# Patient Record
Sex: Female | Born: 1945
Health system: Southern US, Community
[De-identification: ages and names within clinical notes are randomized; demographics above are authoritative.]

## PROBLEM LIST (undated history)

## (undated) DIAGNOSIS — T8859XA Other complications of anesthesia, initial encounter: Secondary | ICD-10-CM

## (undated) DIAGNOSIS — K589 Irritable bowel syndrome without diarrhea: Secondary | ICD-10-CM

## (undated) DIAGNOSIS — E785 Hyperlipidemia, unspecified: Secondary | ICD-10-CM

## (undated) DIAGNOSIS — R112 Nausea with vomiting, unspecified: Secondary | ICD-10-CM

## (undated) DIAGNOSIS — T4145XA Adverse effect of unspecified anesthetic, initial encounter: Secondary | ICD-10-CM

## (undated) DIAGNOSIS — N6320 Unspecified lump in the left breast, unspecified quadrant: Secondary | ICD-10-CM

## (undated) DIAGNOSIS — M810 Age-related osteoporosis without current pathological fracture: Secondary | ICD-10-CM

## (undated) DIAGNOSIS — K219 Gastro-esophageal reflux disease without esophagitis: Secondary | ICD-10-CM

## (undated) DIAGNOSIS — Z9889 Other specified postprocedural states: Secondary | ICD-10-CM

## (undated) HISTORY — PX: INCONTINENCE SURGERY: SHX676

## (undated) HISTORY — PX: ABDOMINAL HYSTERECTOMY: SHX81

## (undated) HISTORY — PX: CHOLECYSTECTOMY: SHX55

## (undated) HISTORY — PX: FOOT SURGERY: SHX648

---

## 2014-09-24 DIAGNOSIS — K5904 Chronic idiopathic constipation: Secondary | ICD-10-CM | POA: Insufficient documentation

## 2015-09-04 DIAGNOSIS — K8010 Calculus of gallbladder with chronic cholecystitis without obstruction: Secondary | ICD-10-CM

## 2015-09-04 HISTORY — DX: Calculus of gallbladder with chronic cholecystitis without obstruction: K80.10

## 2016-04-09 DIAGNOSIS — J180 Bronchopneumonia, unspecified organism: Secondary | ICD-10-CM | POA: Diagnosis not present

## 2016-06-24 DIAGNOSIS — N3000 Acute cystitis without hematuria: Secondary | ICD-10-CM | POA: Diagnosis not present

## 2016-06-24 DIAGNOSIS — N3001 Acute cystitis with hematuria: Secondary | ICD-10-CM | POA: Diagnosis not present

## 2016-06-24 DIAGNOSIS — N390 Urinary tract infection, site not specified: Secondary | ICD-10-CM | POA: Diagnosis not present

## 2016-07-31 DIAGNOSIS — M8580 Other specified disorders of bone density and structure, unspecified site: Secondary | ICD-10-CM | POA: Diagnosis not present

## 2016-07-31 DIAGNOSIS — N6011 Diffuse cystic mastopathy of right breast: Secondary | ICD-10-CM | POA: Diagnosis not present

## 2016-07-31 DIAGNOSIS — Z6827 Body mass index (BMI) 27.0-27.9, adult: Secondary | ICD-10-CM | POA: Diagnosis not present

## 2016-07-31 DIAGNOSIS — E785 Hyperlipidemia, unspecified: Secondary | ICD-10-CM | POA: Diagnosis not present

## 2016-07-31 DIAGNOSIS — K219 Gastro-esophageal reflux disease without esophagitis: Secondary | ICD-10-CM | POA: Diagnosis not present

## 2016-09-09 DIAGNOSIS — N390 Urinary tract infection, site not specified: Secondary | ICD-10-CM | POA: Diagnosis not present

## 2016-09-18 DIAGNOSIS — N959 Unspecified menopausal and perimenopausal disorder: Secondary | ICD-10-CM | POA: Diagnosis not present

## 2016-09-18 DIAGNOSIS — M810 Age-related osteoporosis without current pathological fracture: Secondary | ICD-10-CM | POA: Diagnosis not present

## 2016-09-18 LAB — HM DEXA SCAN

## 2016-09-19 DIAGNOSIS — N3020 Other chronic cystitis without hematuria: Secondary | ICD-10-CM | POA: Diagnosis not present

## 2016-10-01 DIAGNOSIS — N3020 Other chronic cystitis without hematuria: Secondary | ICD-10-CM | POA: Diagnosis not present

## 2016-10-09 DIAGNOSIS — H5203 Hypermetropia, bilateral: Secondary | ICD-10-CM | POA: Diagnosis not present

## 2016-11-14 DIAGNOSIS — N3020 Other chronic cystitis without hematuria: Secondary | ICD-10-CM | POA: Diagnosis not present

## 2016-12-03 DIAGNOSIS — Z1231 Encounter for screening mammogram for malignant neoplasm of breast: Secondary | ICD-10-CM | POA: Diagnosis not present

## 2016-12-24 DIAGNOSIS — Z0000 Encounter for general adult medical examination without abnormal findings: Secondary | ICD-10-CM | POA: Diagnosis not present

## 2017-02-12 DIAGNOSIS — M47816 Spondylosis without myelopathy or radiculopathy, lumbar region: Secondary | ICD-10-CM | POA: Diagnosis not present

## 2017-02-12 DIAGNOSIS — M5417 Radiculopathy, lumbosacral region: Secondary | ICD-10-CM | POA: Diagnosis not present

## 2017-02-12 DIAGNOSIS — M545 Low back pain: Secondary | ICD-10-CM | POA: Diagnosis not present

## 2017-02-12 DIAGNOSIS — M9903 Segmental and somatic dysfunction of lumbar region: Secondary | ICD-10-CM | POA: Diagnosis not present

## 2017-04-22 DIAGNOSIS — Z6827 Body mass index (BMI) 27.0-27.9, adult: Secondary | ICD-10-CM | POA: Diagnosis not present

## 2017-04-22 DIAGNOSIS — N390 Urinary tract infection, site not specified: Secondary | ICD-10-CM | POA: Diagnosis not present

## 2017-04-22 DIAGNOSIS — M810 Age-related osteoporosis without current pathological fracture: Secondary | ICD-10-CM | POA: Diagnosis not present

## 2017-04-22 DIAGNOSIS — K219 Gastro-esophageal reflux disease without esophagitis: Secondary | ICD-10-CM | POA: Diagnosis not present

## 2017-04-22 DIAGNOSIS — E785 Hyperlipidemia, unspecified: Secondary | ICD-10-CM | POA: Diagnosis not present

## 2017-05-27 DIAGNOSIS — J018 Other acute sinusitis: Secondary | ICD-10-CM | POA: Diagnosis not present

## 2017-05-29 DIAGNOSIS — N3020 Other chronic cystitis without hematuria: Secondary | ICD-10-CM | POA: Diagnosis not present

## 2017-05-29 DIAGNOSIS — N393 Stress incontinence (female) (male): Secondary | ICD-10-CM | POA: Diagnosis not present

## 2017-06-19 DIAGNOSIS — J387 Other diseases of larynx: Secondary | ICD-10-CM | POA: Diagnosis not present

## 2017-06-19 DIAGNOSIS — J342 Deviated nasal septum: Secondary | ICD-10-CM | POA: Diagnosis not present

## 2017-06-19 DIAGNOSIS — R0989 Other specified symptoms and signs involving the circulatory and respiratory systems: Secondary | ICD-10-CM | POA: Diagnosis not present

## 2017-06-19 DIAGNOSIS — R05 Cough: Secondary | ICD-10-CM | POA: Diagnosis not present

## 2017-07-15 DIAGNOSIS — R69 Illness, unspecified: Secondary | ICD-10-CM | POA: Diagnosis not present

## 2017-08-08 DIAGNOSIS — J069 Acute upper respiratory infection, unspecified: Secondary | ICD-10-CM | POA: Diagnosis not present

## 2017-08-08 DIAGNOSIS — H66003 Acute suppurative otitis media without spontaneous rupture of ear drum, bilateral: Secondary | ICD-10-CM | POA: Diagnosis not present

## 2017-08-20 DIAGNOSIS — Z6827 Body mass index (BMI) 27.0-27.9, adult: Secondary | ICD-10-CM | POA: Diagnosis not present

## 2017-08-20 DIAGNOSIS — E785 Hyperlipidemia, unspecified: Secondary | ICD-10-CM | POA: Diagnosis not present

## 2017-08-20 DIAGNOSIS — N6324 Unspecified lump in the left breast, lower inner quadrant: Secondary | ICD-10-CM | POA: Diagnosis not present

## 2017-08-20 DIAGNOSIS — K219 Gastro-esophageal reflux disease without esophagitis: Secondary | ICD-10-CM | POA: Diagnosis not present

## 2017-08-20 DIAGNOSIS — M810 Age-related osteoporosis without current pathological fracture: Secondary | ICD-10-CM | POA: Diagnosis not present

## 2017-08-28 ENCOUNTER — Other Ambulatory Visit: Payer: Self-pay | Admitting: Legal Medicine

## 2017-08-28 DIAGNOSIS — N6489 Other specified disorders of breast: Secondary | ICD-10-CM | POA: Diagnosis not present

## 2017-08-28 DIAGNOSIS — N6324 Unspecified lump in the left breast, lower inner quadrant: Secondary | ICD-10-CM | POA: Diagnosis not present

## 2017-08-28 DIAGNOSIS — R928 Other abnormal and inconclusive findings on diagnostic imaging of breast: Secondary | ICD-10-CM

## 2017-08-28 DIAGNOSIS — R921 Mammographic calcification found on diagnostic imaging of breast: Secondary | ICD-10-CM | POA: Diagnosis not present

## 2017-09-02 ENCOUNTER — Ambulatory Visit
Admission: RE | Admit: 2017-09-02 | Discharge: 2017-09-02 | Disposition: A | Discharge status: Home / Self Care | Payer: Medicare HMO | Source: Ambulatory Visit | Attending: Legal Medicine | Admitting: Legal Medicine

## 2017-09-02 DIAGNOSIS — N6012 Diffuse cystic mastopathy of left breast: Secondary | ICD-10-CM | POA: Diagnosis not present

## 2017-09-02 DIAGNOSIS — R928 Other abnormal and inconclusive findings on diagnostic imaging of breast: Secondary | ICD-10-CM

## 2017-09-02 DIAGNOSIS — N6324 Unspecified lump in the left breast, lower inner quadrant: Secondary | ICD-10-CM | POA: Diagnosis not present

## 2017-09-16 ENCOUNTER — Other Ambulatory Visit: Payer: Self-pay | Admitting: Surgery

## 2017-09-16 ENCOUNTER — Other Ambulatory Visit: Payer: Self-pay

## 2017-09-16 ENCOUNTER — Encounter (HOSPITAL_BASED_OUTPATIENT_CLINIC_OR_DEPARTMENT_OTHER): Payer: Self-pay | Admitting: *Deleted

## 2017-09-16 DIAGNOSIS — N6022 Fibroadenosis of left breast: Secondary | ICD-10-CM

## 2017-09-16 NOTE — Pre-Procedure Instructions (Signed)
Patient given Ensure presurgery drink with instructions to drink by 1000, voiced understanding without questions.

## 2017-09-30 ENCOUNTER — Ambulatory Visit
Admission: RE | Admit: 2017-09-30 | Discharge: 2017-09-30 | Disposition: A | Discharge status: Home / Self Care | Payer: Medicare HMO | Source: Ambulatory Visit | Attending: Surgery | Admitting: Surgery

## 2017-09-30 DIAGNOSIS — N6022 Fibroadenosis of left breast: Secondary | ICD-10-CM

## 2017-09-30 DIAGNOSIS — R928 Other abnormal and inconclusive findings on diagnostic imaging of breast: Secondary | ICD-10-CM | POA: Diagnosis not present

## 2017-09-30 NOTE — H&P (Signed)
Kristen Mccarthy Documented: 09/16/2017 10:12 AM Location: [facility] Patient #: [id number] DOB: [birth year] Married / Language: English / Race: White Female   History of Present Illness (Gibson Lad A. Ninfa Linden MD; 09/16/2017 10:33 AM) The patient is a 72 year old female who presents with a breast mass. This patient is referred by Dr. Everlean Alstrom for evaluation of a complex sclerosing lesion recently diagnosed in the left breast on screening mammography. She has had fibrocystic areas in her breast in the past. She also has a family history of breast cancer in her mother at age 57. She denies nipple discharge. She has not had biopsies or need for surgery on the breast in the past. She is otherwise well without complaints.   Past Surgical History (April Staton, CMA; 09/16/2017 10:12 AM) Breast Biopsy  Left. Foot Surgery  Left. Gallbladder Surgery - Laparoscopic  Hysterectomy (not due to cancer) - Partial  Oral Surgery  Tonsillectomy   Diagnostic Studies History (April Staton, CMA; 09/16/2017 10:12 AM) Colonoscopy  5-10 years ago Mammogram  within last year  Allergies (April Staton, CMA; 09/16/2017 10:13 AM) Azithromycin *CHEMICALS*  Penicillins  Sulfa Antibiotics   Medication History (April Staton, CMA; 09/16/2017 10:15 AM) Rosuvastatin Calcium (10MG  Tablet, Oral) Active. Omeprazole (40MG  Capsule DR, Oral) Active. Nitrofurantoin Macrocrystal (50MG  Capsule, Oral) Active. Alendronate Sodium (70MG  Tablet, Oral) Active. MagOx 400 (400 (241.3 Mg)MG Tablet, Oral) Active. Calcium Citrate Plus (Oral) Active. Vitamin D (1000UNIT Tablet, Oral) Active. Medications Reconciled  Social History (April Staton, CMA; 09/16/2017 10:12 AM) No alcohol use  No caffeine use  No drug use  Tobacco use  Never smoker.  Family History (April Staton, CMA; 09/16/2017 10:12 AM) Alcohol Abuse  Brother. Arthritis  Brother, Mother, Sister. Breast Cancer  Mother. Heart  Disease  Father. Heart disease in female family member before age 93  Migraine Headache  Mother. Respiratory Condition  Mother.  Pregnancy / Birth History (April Staton, [place]; 09/16/2017 10:12 AM) Age at menarche  60 years. Gravida  2 Irregular periods  Maternal age  58-20 Para  2  Other Problems (April Staton, CMA; 09/16/2017 10:12 AM) Arthritis  Back Pain  Bladder Problems  Gastroesophageal Reflux Disease  Other disease, cancer, significant illness     Review of Systems (April Staton CMA; 09/16/2017 10:12 AM) General Not Present- Appetite Loss, Chills, Fatigue, Fever, Night Sweats, Weight Gain and Weight Loss. Skin Not Present- Change in Wart/Mole, Dryness, Hives, Jaundice, New Lesions, Non-Healing Wounds, Rash and Ulcer. HEENT Present- Seasonal Allergies. Not Present- Earache, Hearing Loss, Hoarseness, Nose Bleed, Oral Ulcers, Ringing in the Ears, Sinus Pain, Sore Throat, Visual Disturbances, Wears glasses/contact lenses and Yellow Eyes. Respiratory Not Present- Bloody sputum, Chronic Cough, Difficulty Breathing, Snoring and Wheezing. Breast Present- Breast Mass. Not Present- Breast Pain, Nipple Discharge and Skin Changes. Cardiovascular Present- Leg Cramps. Not Present- Chest Pain, Difficulty Breathing Lying Down, Palpitations, Rapid Heart Rate, Shortness of Breath and Swelling of Extremities. Gastrointestinal Not Present- Abdominal Pain, Bloating, Bloody Stool, Change in Bowel Habits, Chronic diarrhea, Constipation, Difficulty Swallowing, Excessive gas, Gets full quickly at meals, Hemorrhoids, Indigestion, Nausea, Rectal Pain and Vomiting. Female Genitourinary Not Present- Frequency, Nocturia, Painful Urination, Pelvic Pain and Urgency. Musculoskeletal Present- Joint Stiffness. Not Present- Back Pain, Joint Pain, Muscle Pain, Muscle Weakness and Swelling of Extremities. Neurological Not Present- Decreased Memory, Fainting, Headaches, Numbness, Seizures, Tingling, Tremor,  Trouble walking and Weakness. Psychiatric Not Present- Anxiety, Bipolar, Change in Sleep Pattern, Depression, Fearful and Frequent crying. Endocrine Not Present- Cold Intolerance, Excessive Hunger,  Hair Changes, Heat Intolerance, Hot flashes and New Diabetes. Hematology Not Present- Blood Thinners, Easy Bruising, Excessive bleeding, Gland problems, HIV and Persistent Infections.  Vitals (April Staton CMA; 09/16/2017 10:16 AM) 09/16/2017 10:15 AM Weight: 153.38 lb Height: 62.5in Body Surface Area: 1.72 m Body Mass Index: 27.61 kg/m  Temp.: 98.89F(Oral)  Pulse: 97 (Regular)  BP: 148/88 (Sitting, Left Arm, Standard)       Physical Exam (Daryll Spisak A. Ninfa Linden MD; 09/16/2017 10:34 AM) General Mental Status-Alert. General Appearance-Consistent with stated age. Hydration-Well hydrated. Voice-Normal.  Head and Neck Head-normocephalic, atraumatic with no lesions or palpable masses. Trachea-midline. Thyroid Gland Characteristics - normal size and consistency.  Eye Eyeball - Bilateral-Extraocular movements intact. Sclera/Conjunctiva - Bilateral-No scleral icterus.  Chest and Lung Exam Chest and lung exam reveals -quiet, even and easy respiratory effort with no use of accessory muscles and on auscultation, normal breath sounds, no adventitious sounds and normal vocal resonance. Inspection Chest Wall - Normal. Back - normal.  Breast Breast - Left-Symmetric, Non Tender, No Biopsy scars, no Dimpling, No Inflammation, No Lumpectomy scars, No Mastectomy scars, No Peau d' Orange. Breast - Right-Symmetric, Non Tender, No Biopsy scars, no Dimpling, No Inflammation, No Lumpectomy scars, No Mastectomy scars, No Peau d' Orange. Breast Lump-No Palpable Breast Mass. Note: There are no palpable masses in either breast. There is ecchymosis in the inner lower quadrant of the left breast from the biopsy   Cardiovascular Cardiovascular examination reveals -normal  heart sounds, regular rate and rhythm with no murmurs and normal pedal pulses bilaterally.  Abdomen - Did not examine.  Neurologic - Did not examine.  Musculoskeletal - Did not examine.  Lymphatic Head & Neck  General Head & Neck Lymphatics: Bilateral - Description - Normal. Axillary  General Axillary Region: Bilateral - Description - Normal. Tenderness - Non Tender. Femoral & Inguinal - Did not examine.    Assessment & Plan (Jazzman Loughmiller A. Ninfa Linden MD; 09/16/2017 10:35 AM) SCLEROSING ADENOSIS OF BREAST, LEFT (N60.22) Impression: I have reviewed the patient's pathology results as well as her mammograms and ultrasound. I gave the family a copy of the pathology results. This is a complex sclerosing lesion with fibrocystic changes of the left breast. Complete surgical excision of this area is recommended with a radioactive seed guided left breast lumpectomy. I discussed the reasons for this with the patient and her husband. I discussed the surgical procedure in detail. I discussed the risks which includes but is not limited to bleeding, infection, injury to surrounding structures, the need for further procedures or surgery if malignancy is found, cardiopulmonary issues, postoperative recovery, etc. She understands and wishes to proceed with surgery which will be scheduled

## 2017-10-01 ENCOUNTER — Encounter (HOSPITAL_BASED_OUTPATIENT_CLINIC_OR_DEPARTMENT_OTHER)
Admission: RE | Disposition: A | Discharge status: Home / Self Care | Payer: Self-pay | Source: Ambulatory Visit | Attending: Surgery

## 2017-10-01 ENCOUNTER — Other Ambulatory Visit: Payer: Self-pay

## 2017-10-01 ENCOUNTER — Ambulatory Visit (HOSPITAL_BASED_OUTPATIENT_CLINIC_OR_DEPARTMENT_OTHER)
Admission: RE | Admit: 2017-10-01 | Discharge: 2017-10-01 | Disposition: A | Discharge status: Home / Self Care | Payer: Medicare HMO | Source: Ambulatory Visit | Attending: Surgery | Admitting: Surgery

## 2017-10-01 ENCOUNTER — Ambulatory Visit
Admission: RE | Admit: 2017-10-01 | Discharge: 2017-10-01 | Disposition: A | Discharge status: Home / Self Care | Payer: Medicare HMO | Source: Ambulatory Visit | Attending: Surgery | Admitting: Surgery

## 2017-10-01 ENCOUNTER — Ambulatory Visit (HOSPITAL_BASED_OUTPATIENT_CLINIC_OR_DEPARTMENT_OTHER): Payer: Medicare HMO | Admitting: Certified Registered"

## 2017-10-01 ENCOUNTER — Encounter (HOSPITAL_BASED_OUTPATIENT_CLINIC_OR_DEPARTMENT_OTHER): Payer: Self-pay

## 2017-10-01 DIAGNOSIS — R921 Mammographic calcification found on diagnostic imaging of breast: Secondary | ICD-10-CM | POA: Diagnosis not present

## 2017-10-01 DIAGNOSIS — K219 Gastro-esophageal reflux disease without esophagitis: Secondary | ICD-10-CM | POA: Insufficient documentation

## 2017-10-01 DIAGNOSIS — M1990 Unspecified osteoarthritis, unspecified site: Secondary | ICD-10-CM | POA: Diagnosis not present

## 2017-10-01 DIAGNOSIS — Z8249 Family history of ischemic heart disease and other diseases of the circulatory system: Secondary | ICD-10-CM | POA: Insufficient documentation

## 2017-10-01 DIAGNOSIS — Z880 Allergy status to penicillin: Secondary | ICD-10-CM | POA: Diagnosis not present

## 2017-10-01 DIAGNOSIS — Z881 Allergy status to other antibiotic agents status: Secondary | ICD-10-CM | POA: Diagnosis not present

## 2017-10-01 DIAGNOSIS — Z90710 Acquired absence of both cervix and uterus: Secondary | ICD-10-CM | POA: Insufficient documentation

## 2017-10-01 DIAGNOSIS — Z79899 Other long term (current) drug therapy: Secondary | ICD-10-CM | POA: Diagnosis not present

## 2017-10-01 DIAGNOSIS — N6489 Other specified disorders of breast: Secondary | ICD-10-CM | POA: Insufficient documentation

## 2017-10-01 DIAGNOSIS — N6022 Fibroadenosis of left breast: Secondary | ICD-10-CM

## 2017-10-01 DIAGNOSIS — Z882 Allergy status to sulfonamides status: Secondary | ICD-10-CM | POA: Insufficient documentation

## 2017-10-01 DIAGNOSIS — Z803 Family history of malignant neoplasm of breast: Secondary | ICD-10-CM | POA: Diagnosis not present

## 2017-10-01 DIAGNOSIS — R928 Other abnormal and inconclusive findings on diagnostic imaging of breast: Secondary | ICD-10-CM | POA: Diagnosis not present

## 2017-10-01 HISTORY — PX: BREAST LUMPECTOMY WITH RADIOACTIVE SEED LOCALIZATION: SHX6424

## 2017-10-01 HISTORY — DX: Hyperlipidemia, unspecified: E78.5

## 2017-10-01 HISTORY — DX: Irritable bowel syndrome, unspecified: K58.9

## 2017-10-01 HISTORY — DX: Gastro-esophageal reflux disease without esophagitis: K21.9

## 2017-10-01 HISTORY — DX: Other complications of anesthesia, initial encounter: T88.59XA

## 2017-10-01 HISTORY — DX: Unspecified lump in the left breast, unspecified quadrant: N63.20

## 2017-10-01 HISTORY — DX: Age-related osteoporosis without current pathological fracture: M81.0

## 2017-10-01 HISTORY — DX: Other specified postprocedural states: Z98.890

## 2017-10-01 HISTORY — DX: Nausea with vomiting, unspecified: R11.2

## 2017-10-01 HISTORY — DX: Nausea with vomiting, unspecified: Z98.890

## 2017-10-01 HISTORY — DX: Adverse effect of unspecified anesthetic, initial encounter: T41.45XA

## 2017-10-01 SURGERY — BREAST LUMPECTOMY WITH RADIOACTIVE SEED LOCALIZATION
Anesthesia: General | Site: Breast | Laterality: Left

## 2017-10-01 MED ORDER — TRAMADOL HCL 50 MG PO TABS: 50.0000 mg | ORAL_TABLET | Freq: Four times a day (QID) | ORAL | 0 refills | Status: DC | PRN

## 2017-10-01 MED ORDER — DEXAMETHASONE SODIUM PHOSPHATE 10 MG/ML IJ SOLN
INTRAMUSCULAR | Status: DC | PRN
  Administered 2017-10-01: 10 mg via INTRAVENOUS

## 2017-10-01 MED ORDER — CIPROFLOXACIN IN D5W 400 MG/200ML IV SOLN
INTRAVENOUS | Status: DC | PRN
  Administered 2017-10-01: 400 mg via INTRAVENOUS

## 2017-10-01 MED ORDER — CEFAZOLIN SODIUM-DEXTROSE 2-4 GM/100ML-% IV SOLN: 2.0000 g | INTRAVENOUS | Status: DC

## 2017-10-01 MED ORDER — BUPIVACAINE-EPINEPHRINE (PF) 0.5% -1:200000 IJ SOLN
INTRAMUSCULAR | Status: AC
  Filled 2017-10-01: qty 30

## 2017-10-01 MED ORDER — SODIUM BICARBONATE 4 % IV SOLN
INTRAVENOUS | Status: AC
  Filled 2017-10-01: qty 5

## 2017-10-01 MED ORDER — PROPOFOL 10 MG/ML IV BOLUS
INTRAVENOUS | Status: AC
  Filled 2017-10-01: qty 40

## 2017-10-01 MED ORDER — LACTATED RINGERS IV SOLN
INTRAVENOUS | Status: DC
  Administered 2017-10-01 (×2): via INTRAVENOUS

## 2017-10-01 MED ORDER — FENTANYL CITRATE (PF) 100 MCG/2ML IJ SOLN
INTRAMUSCULAR | Status: AC
  Filled 2017-10-01: qty 2

## 2017-10-01 MED ORDER — ACETAMINOPHEN 500 MG PO TABS
1000.0000 mg | ORAL_TABLET | ORAL | Status: AC
  Administered 2017-10-01: 1000 mg via ORAL

## 2017-10-01 MED ORDER — MIDAZOLAM HCL 2 MG/2ML IJ SOLN: 1.0000 mg | INTRAMUSCULAR | Status: DC | PRN

## 2017-10-01 MED ORDER — OXYCODONE HCL 5 MG/5ML PO SOLN: 5.0000 mg | Freq: Once | ORAL | Status: DC | PRN

## 2017-10-01 MED ORDER — CEFAZOLIN SODIUM-DEXTROSE 2-4 GM/100ML-% IV SOLN
INTRAVENOUS | Status: AC
  Filled 2017-10-01: qty 100

## 2017-10-01 MED ORDER — DEXAMETHASONE SODIUM PHOSPHATE 10 MG/ML IJ SOLN
INTRAMUSCULAR | Status: AC
  Filled 2017-10-01: qty 1

## 2017-10-01 MED ORDER — CELECOXIB 200 MG PO CAPS
ORAL_CAPSULE | ORAL | Status: AC
  Filled 2017-10-01: qty 1

## 2017-10-01 MED ORDER — PHENYLEPHRINE HCL 10 MG/ML IJ SOLN
INTRAMUSCULAR | Status: DC | PRN
  Administered 2017-10-01: 80 ug via INTRAVENOUS
  Administered 2017-10-01 (×3): 120 ug via INTRAVENOUS
  Administered 2017-10-01: 80 ug via INTRAVENOUS

## 2017-10-01 MED ORDER — CIPROFLOXACIN IN D5W 400 MG/200ML IV SOLN
INTRAVENOUS | Status: AC
  Filled 2017-10-01: qty 200

## 2017-10-01 MED ORDER — CHLORHEXIDINE GLUCONATE CLOTH 2 % EX PADS: 6.0000 | MEDICATED_PAD | Freq: Once | CUTANEOUS | Status: DC

## 2017-10-01 MED ORDER — FENTANYL CITRATE (PF) 100 MCG/2ML IJ SOLN: 25.0000 ug | INTRAMUSCULAR | Status: DC | PRN

## 2017-10-01 MED ORDER — GABAPENTIN 300 MG PO CAPS
ORAL_CAPSULE | ORAL | Status: AC
  Filled 2017-10-01: qty 1

## 2017-10-01 MED ORDER — BUPIVACAINE-EPINEPHRINE 0.5% -1:200000 IJ SOLN
INTRAMUSCULAR | Status: DC | PRN
  Administered 2017-10-01: 12 mL

## 2017-10-01 MED ORDER — GABAPENTIN 300 MG PO CAPS
300.0000 mg | ORAL_CAPSULE | ORAL | Status: AC
  Administered 2017-10-01: 300 mg via ORAL

## 2017-10-01 MED ORDER — ONDANSETRON HCL 4 MG/2ML IJ SOLN
INTRAMUSCULAR | Status: DC | PRN
  Administered 2017-10-01: 4 mg via INTRAVENOUS

## 2017-10-01 MED ORDER — OXYCODONE HCL 5 MG PO TABS: 5.0000 mg | ORAL_TABLET | Freq: Once | ORAL | Status: DC | PRN

## 2017-10-01 MED ORDER — MEPERIDINE HCL 25 MG/ML IJ SOLN: 6.2500 mg | INTRAMUSCULAR | Status: DC | PRN

## 2017-10-01 MED ORDER — LIDOCAINE HCL (PF) 1 % IJ SOLN
INTRAMUSCULAR | Status: AC
  Filled 2017-10-01: qty 30

## 2017-10-01 MED ORDER — SCOPOLAMINE 1 MG/3DAYS TD PT72: 1.0000 | MEDICATED_PATCH | Freq: Once | TRANSDERMAL | Status: DC | PRN

## 2017-10-01 MED ORDER — PROPOFOL 10 MG/ML IV BOLUS
INTRAVENOUS | Status: DC | PRN
  Administered 2017-10-01: 150 mg via INTRAVENOUS

## 2017-10-01 MED ORDER — CELECOXIB 200 MG PO CAPS
200.0000 mg | ORAL_CAPSULE | ORAL | Status: AC
  Administered 2017-10-01: 200 mg via ORAL

## 2017-10-01 MED ORDER — PROMETHAZINE HCL 25 MG/ML IJ SOLN: 6.2500 mg | INTRAMUSCULAR | Status: DC | PRN

## 2017-10-01 MED ORDER — ACETAMINOPHEN 500 MG PO TABS
ORAL_TABLET | ORAL | Status: AC
  Filled 2017-10-01: qty 2

## 2017-10-01 MED ORDER — FENTANYL CITRATE (PF) 100 MCG/2ML IJ SOLN
50.0000 ug | INTRAMUSCULAR | Status: DC | PRN
  Administered 2017-10-01 (×2): 50 ug via INTRAVENOUS

## 2017-10-01 MED ORDER — LIDOCAINE HCL (CARDIAC) PF 100 MG/5ML IV SOSY
PREFILLED_SYRINGE | INTRAVENOUS | Status: DC | PRN
  Administered 2017-10-01: 100 mg via INTRAVENOUS

## 2017-10-01 MED ORDER — ONDANSETRON HCL 4 MG/2ML IJ SOLN
INTRAMUSCULAR | Status: AC
  Filled 2017-10-01: qty 2

## 2017-10-01 MED ORDER — LIDOCAINE HCL (CARDIAC) PF 100 MG/5ML IV SOSY
PREFILLED_SYRINGE | INTRAVENOUS | Status: AC
  Filled 2017-10-01: qty 5

## 2017-10-01 SURGICAL SUPPLY — 43 items
APPLIER CLIP 9.375 MED OPEN (MISCELLANEOUS)
BLADE HEX COATED 2.75 (ELECTRODE) ×2 IMPLANT
BLADE SURG 15 STRL LF DISP TIS (BLADE) ×1 IMPLANT
BLADE SURG 15 STRL SS (BLADE) ×1
CANISTER SUC SOCK COL 7IN (MISCELLANEOUS) IMPLANT
CANISTER SUCT 1200ML W/VALVE (MISCELLANEOUS) ×2 IMPLANT
CHLORAPREP W/TINT 26ML (MISCELLANEOUS) ×2 IMPLANT
CLIP APPLIE 9.375 MED OPEN (MISCELLANEOUS) IMPLANT
CLIP VESOCCLUDE SM WIDE 6/CT (CLIP) ×2 IMPLANT
COVER BACK TABLE 60X90IN (DRAPES) ×2 IMPLANT
COVER MAYO STAND STRL (DRAPES) ×2 IMPLANT
COVER PROBE W GEL 5X96 (DRAPES) ×2 IMPLANT
DECANTER SPIKE VIAL GLASS SM (MISCELLANEOUS) IMPLANT
DERMABOND ADVANCED (GAUZE/BANDAGES/DRESSINGS) ×1
DERMABOND ADVANCED .7 DNX12 (GAUZE/BANDAGES/DRESSINGS) ×1 IMPLANT
DEVICE DUBIN W/COMP PLATE 8390 (MISCELLANEOUS) ×2 IMPLANT
DRAPE LAPAROSCOPIC ABDOMINAL (DRAPES) ×2 IMPLANT
DRAPE UTILITY XL STRL (DRAPES) ×2 IMPLANT
ELECT REM PT RETURN 9FT ADLT (ELECTROSURGICAL) ×2
ELECTRODE REM PT RTRN 9FT ADLT (ELECTROSURGICAL) ×1 IMPLANT
GAUZE SPONGE 4X4 12PLY STRL LF (GAUZE/BANDAGES/DRESSINGS) IMPLANT
GLOVE SURG SIGNA 7.5 PF LTX (GLOVE) ×2 IMPLANT
GLOVE SURG SS PI 7.0 STRL IVOR (GLOVE) ×2 IMPLANT
GOWN STRL REUS W/ TWL LRG LVL3 (GOWN DISPOSABLE) ×1 IMPLANT
GOWN STRL REUS W/ TWL XL LVL3 (GOWN DISPOSABLE) ×1 IMPLANT
GOWN STRL REUS W/TWL LRG LVL3 (GOWN DISPOSABLE) ×1
GOWN STRL REUS W/TWL XL LVL3 (GOWN DISPOSABLE) ×1
KIT MARKER MARGIN INK (KITS) ×2 IMPLANT
NEEDLE HYPO 25X1 1.5 SAFETY (NEEDLE) ×2 IMPLANT
NS IRRIG 1000ML POUR BTL (IV SOLUTION) IMPLANT
PACK BASIN DAY SURGERY FS (CUSTOM PROCEDURE TRAY) ×2 IMPLANT
PENCIL BUTTON HOLSTER BLD 10FT (ELECTRODE) ×2 IMPLANT
SLEEVE SCD COMPRESS KNEE MED (MISCELLANEOUS) ×2 IMPLANT
SPONGE LAP 4X18 RFD (DISPOSABLE) ×2 IMPLANT
SUT MNCRL AB 4-0 PS2 18 (SUTURE) ×2 IMPLANT
SUT SILK 2 0 SH (SUTURE) IMPLANT
SUT VIC AB 3-0 SH 27 (SUTURE) ×1
SUT VIC AB 3-0 SH 27X BRD (SUTURE) ×1 IMPLANT
SYR CONTROL 10ML LL (SYRINGE) ×2 IMPLANT
TOWEL GREEN STERILE FF (TOWEL DISPOSABLE) ×2 IMPLANT
TOWEL OR NON WOVEN STRL DISP B (DISPOSABLE) IMPLANT
TUBE CONNECTING 20X1/4 (TUBING) ×2 IMPLANT
YANKAUER SUCT BULB TIP NO VENT (SUCTIONS) ×2 IMPLANT

## 2017-10-01 NOTE — Anesthesia Procedure Notes (Signed)
Procedure Name: LMA Insertion Performed by: Verita Lamb, CRNA Pre-anesthesia Checklist: Patient identified, Suction available, Emergency Drugs available, Patient being monitored and Timeout performed Patient Re-evaluated:Patient Re-evaluated prior to induction Preoxygenation: Pre-oxygenation with 100% oxygen Induction Type: IV induction Ventilation: Mask ventilation without difficulty LMA: LMA inserted LMA Size: 3.0 Tube type: Oral Number of attempts: 1 Placement Confirmation: positive ETCO2,  CO2 detector and breath sounds checked- equal and bilateral Tube secured with: Tape Dental Injury: Teeth and Oropharynx as per pre-operative assessment

## 2017-10-01 NOTE — Op Note (Signed)
BREAST LUMPECTOMY WITH RADIOACTIVE SEED LOCALIZATION  Procedure Note  Sharmin Dianne Reckner 10/01/2017   Pre-op Diagnosis: COMPLEX SCLEROSING LESION LEFT BREAST     Post-op Diagnosis: same  Procedure(s): BREAST LUMPECTOMY WITH RADIOACTIVE SEED LOCALIZATION  Surgeon(s): Coralie Keens, MD  Anesthesia: General  Staff:  Circulator: Antionette Poles, RN Scrub Person: Leroy Libman, RN Circulator Assistant: Silvio Clayman, CST  Estimated Blood Loss: Minimal               Specimens: sent to path  Indications: This is a 72 year old female with a small right breast mass.  Biopsy showed a complex sclerosing lesion the decision was made to proceed to the operating room for a lumpectomy  Procedure: The patient was brought to the operating room and identified as correct patient.  She was placed upon the operating room table and general anesthesia was induced.  Her left breast was then prepped and draped in the usual sterile fashion.  The radioactive seed had been localized at the medial portion of the left breast adjacent to the areola.  I anesthetized the skin of the areola with Marcaine and made a circumareolar incision with a scalpel.  Then using the neoprobe, I performed a lumpectomy staying widely around the radioactive seed.  Once the specimen was completely removed I again confirmed that the seed was then the specimen with the neoprobe.  All margins were marked with paint.  An x-ray was performed confirming that the seed in the previous marker were in the specimen.  The lumpectomy specimen was then sent to pathology for evaluation.  I achieved hemostasis with the cautery.  I placed 2 small surgical clips at the area of the lumpectomy.  I then anesthetized the wound further with Marcaine.  I then closed the subcutaneous tissue with interrupted 3-0 Vicryl sutures and closed the skin with a running 4-0 Monocryl.  Dermabond was then applied.  The patient was then placed in a binder.  The  patient tolerated procedure well.  All the counts were correct at the end of the procedure.  The patient was then extubated in the operating room and taken in a stable condition to the recovery room.          Derrian Poli A   Date: 10/01/2017  Time: 1:00 PM

## 2017-10-01 NOTE — Transfer of Care (Signed)
Immediate Anesthesia Transfer of Care Note  Patient: Kristen Mccarthy  Procedure(s) Performed: BREAST LUMPECTOMY WITH RADIOACTIVE SEED LOCALIZATION (Left Breast)  Patient Location: PACU  Anesthesia Type:General  Level of Consciousness: awake, alert  and oriented  Airway & Oxygen Therapy: Patient Spontanous Breathing and Patient connected to face mask oxygen  Post-op Assessment: Report given to RN and Post -op Vital signs reviewed and stable  Post vital signs: Reviewed and stable  Last Vitals:  Vitals Value Taken Time  BP 117/68 10/01/2017  1:09 PM  Temp    Pulse 101 10/01/2017  1:10 PM  Resp 17 10/01/2017  1:10 PM  SpO2 97 % 10/01/2017  1:10 PM  Vitals shown include unvalidated device data.  Last Pain:  Vitals:   10/01/17 1214  TempSrc: Oral  PainSc: 0-No pain         Complications: No apparent anesthesia complications

## 2017-10-01 NOTE — Discharge Instructions (Signed)
[place] Office Phone Number [phone]  BREAST BIOPSY/ PARTIAL MASTECTOMY: POST OP INSTRUCTIONS  Always review your discharge instruction sheet given to you by the facility where your surgery was performed.  IF YOU HAVE DISABILITY OR FAMILY LEAVE FORMS, YOU MUST BRING THEM TO THE OFFICE FOR PROCESSING.  DO NOT GIVE THEM TO YOUR DOCTOR.  1. A prescription for pain medication may be given to you upon discharge.  Take your pain medication as prescribed, if needed.  If narcotic pain medicine is not needed, then you may take acetaminophen (Tylenol) or ibuprofen (Advil) as needed. 2. Take your usually prescribed medications unless otherwise directed 3. If you need a refill on your pain medication, please contact your pharmacy.  They will contact our office to request authorization.  Prescriptions will not be filled after 5pm or on week-ends. 4. You should eat very light the first 24 hours after surgery, such as soup, crackers, pudding, etc.  Resume your normal diet the day after surgery. 5. Most patients will experience some swelling and bruising in the breast.  Ice packs and a good support bra will help.  Swelling and bruising can take several days to resolve.  6. It is common to experience some constipation if taking pain medication after surgery.  Increasing fluid intake and taking a stool softener will usually help or prevent this problem from occurring.  A mild laxative (Milk of Magnesia or Miralax) should be taken according to package directions if there are no bowel movements after 48 hours. 7. Unless discharge instructions indicate otherwise, you may remove your bandages 24-48 hours after surgery, and you may shower at that time.  You may have steri-strips (small skin tapes) in place directly over the incision.  These strips should be left on the skin for 7-10 days.  If your surgeon used skin glue on the incision, you may shower in 24 hours.  The glue will flake off over the  next 2-3 weeks.  Any sutures or staples will be removed at the office during your follow-up visit. 8. ACTIVITIES:  You may resume regular daily activities (gradually increasing) beginning the next day.  Wearing a good support bra or sports bra minimizes pain and swelling.  You may have sexual intercourse when it is comfortable. a. You may drive when you no longer are taking prescription pain medication, you can comfortably wear a seatbelt, and you can safely maneuver your car and apply brakes. b. RETURN TO WORK:  ______________________________________________________________________________________ 9. You should see your doctor in the office for a follow-up appointment approximately two weeks after your surgery.  Your doctors nurse will typically make your follow-up appointment when she calls you with your pathology report.  Expect your pathology report 2-3 business days after your surgery.  You may call to check if you do not hear from [place] after three days. 10. OTHER INSTRUCTIONS: __OK TO REMOVE BINDER AND STARTING SHOWERING TOMORROW 11. ICE PACK, TYLENOL, AND IBUPROFEN FOR PAIN 12. _____________________________________________________________________________________________ _____________________________________________________________________________________________________________________________________ _____________________________________________________________________________________________________________________________________ _____________________________________________________________________________________________________________________________________  WHEN TO CALL YOUR DOCTOR: 1. Fever over 101.0 2. Nausea and/or vomiting. 3. Extreme swelling or bruising. 4. Continued bleeding from incision. 5. Increased pain, redness, or drainage from the incision.  The clinic staff is available to answer your questions during regular business hours.  Please dont hesitate to call and ask to  speak to one of the nurses for clinical concerns.  If you have a medical emergency, go to the nearest emergency room or call 911.  A surgeon from [facility] is always on call at the hospital.  For further questions, please visit [email or web address]    Post Anesthesia Home Care Instructions  Activity: Get plenty of rest for the remainder of the day. A responsible individual must stay with you for 24 hours following the procedure.  For the next 24 hours, DO NOT: -Drive a car -Paediatric nurse -Drink alcoholic beverages -Take any medication unless instructed by your physician -Make any legal decisions or sign important papers.  Meals: Start with liquid foods such as gelatin or soup. Progress to regular foods as tolerated. Avoid greasy, spicy, heavy foods. If nausea and/or vomiting occur, drink only clear liquids until the nausea and/or vomiting subsides. Call your physician if vomiting continues.  Special Instructions/Symptoms: Your throat may feel dry or sore from the anesthesia or the breathing tube placed in your throat during surgery. If this causes discomfort, gargle with warm salt water. The discomfort should disappear within 24 hours.  If you had a scopolamine patch placed behind your ear for the management of post- operative nausea and/or vomiting:  1. The medication in the patch is effective for 72 hours, after which it should be removed.  Wrap patch in a tissue and discard in the trash. Wash hands thoroughly with soap and water. 2. You may remove the patch earlier than 72 hours if you experience unpleasant side effects which may include dry mouth, dizziness or visual disturbances. 3. Avoid touching the patch. Wash your hands with soap and water after contact with the patch.

## 2017-10-01 NOTE — Interval H&P Note (Signed)
History and Physical Interval Note:no change in H and P  10/01/2017 12:16 PM  Kristen Mccarthy  has presented today for surgery, with the diagnosis of COMPLEX SCLEROSING LESION LEFT BREAST  The various methods of treatment have been discussed with the patient and family. After consideration of risks, benefits and other options for treatment, the patient has consented to  Procedure(s): BREAST LUMPECTOMY WITH RADIOACTIVE SEED LOCALIZATION (Left) as a surgical intervention .  The patient's history has been reviewed, patient examined, no change in status, stable for surgery.  I have reviewed the patient's chart and labs.  Questions were answered to the patient's satisfaction.     Kimmie Doren A

## 2017-10-01 NOTE — Anesthesia Preprocedure Evaluation (Signed)
Anesthesia Evaluation  Patient identified by MRN, date of birth, ID band Patient awake    Reviewed: Allergy & Precautions, NPO status , Patient's Chart, lab work & pertinent test results  History of Anesthesia Complications (+) PONV and history of anesthetic complications  Airway Mallampati: II  TM Distance: >3 FB Neck ROM: Full    Dental no notable dental hx.    Pulmonary neg pulmonary ROS,    Pulmonary exam normal breath sounds clear to auscultation       Cardiovascular negative cardio ROS Normal cardiovascular exam Rhythm:Regular Rate:Normal     Neuro/Psych negative neurological ROS  negative psych ROS   GI/Hepatic Neg liver ROS, GERD  ,  Endo/Other  negative endocrine ROS  Renal/GU negative Renal ROS     Musculoskeletal negative musculoskeletal ROS (+)   Abdominal   Peds  Hematology negative hematology ROS (+)   Anesthesia Other Findings   Reproductive/Obstetrics negative OB ROS                             Anesthesia Physical Anesthesia Plan  ASA: II  Anesthesia Plan: General   Post-op Pain Management:    Induction: Intravenous  PONV Risk Score and Plan: 3 and Ondansetron, Dexamethasone and Treatment may vary due to age or medical condition  Airway Management Planned: LMA  Additional Equipment:   Intra-op Plan:   Post-operative Plan: Extubation in OR  Informed Consent: I have reviewed the patients History and Physical, chart, labs and discussed the procedure including the risks, benefits and alternatives for the proposed anesthesia with the patient or authorized representative who has indicated his/her understanding and acceptance.   Dental advisory given  Plan Discussed with: CRNA  Anesthesia Plan Comments:         Anesthesia Quick Evaluation

## 2017-10-03 ENCOUNTER — Encounter (HOSPITAL_BASED_OUTPATIENT_CLINIC_OR_DEPARTMENT_OTHER): Payer: Self-pay | Admitting: Surgery

## 2017-10-03 NOTE — Anesthesia Postprocedure Evaluation (Signed)
Anesthesia Post Note  Patient: Kristen Mccarthy  Procedure(s) Performed: BREAST LUMPECTOMY WITH RADIOACTIVE SEED LOCALIZATION (Left Breast)     Patient location during evaluation: PACU Anesthesia Type: General Level of consciousness: sedated and patient cooperative Pain management: pain level controlled Vital Signs Assessment: post-procedure vital signs reviewed and stable Respiratory status: spontaneous breathing Cardiovascular status: stable Anesthetic complications: no    Last Vitals:  Vitals:   10/01/17 1345 10/01/17 1400  BP: 140/82 (!) 152/85  Pulse: 81 80  Resp: 10 16  Temp:  36.7 C  SpO2: 98% 97%    Last Pain:  Vitals:   10/03/17 1049  TempSrc:   PainSc: 0-No pain                 Nolon Nations

## 2017-12-11 DIAGNOSIS — N393 Stress incontinence (female) (male): Secondary | ICD-10-CM | POA: Diagnosis not present

## 2017-12-11 DIAGNOSIS — N3021 Other chronic cystitis with hematuria: Secondary | ICD-10-CM | POA: Diagnosis not present

## 2017-12-11 DIAGNOSIS — N3020 Other chronic cystitis without hematuria: Secondary | ICD-10-CM | POA: Diagnosis not present

## 2017-12-23 DIAGNOSIS — Z23 Encounter for immunization: Secondary | ICD-10-CM | POA: Diagnosis not present

## 2017-12-23 DIAGNOSIS — M810 Age-related osteoporosis without current pathological fracture: Secondary | ICD-10-CM | POA: Diagnosis not present

## 2017-12-23 DIAGNOSIS — Z6826 Body mass index (BMI) 26.0-26.9, adult: Secondary | ICD-10-CM | POA: Diagnosis not present

## 2017-12-23 DIAGNOSIS — K219 Gastro-esophageal reflux disease without esophagitis: Secondary | ICD-10-CM | POA: Diagnosis not present

## 2017-12-23 DIAGNOSIS — E782 Mixed hyperlipidemia: Secondary | ICD-10-CM | POA: Diagnosis not present

## 2018-01-08 DIAGNOSIS — Z6826 Body mass index (BMI) 26.0-26.9, adult: Secondary | ICD-10-CM | POA: Diagnosis not present

## 2018-01-08 DIAGNOSIS — Z0000 Encounter for general adult medical examination without abnormal findings: Secondary | ICD-10-CM | POA: Diagnosis not present

## 2018-01-13 DIAGNOSIS — R69 Illness, unspecified: Secondary | ICD-10-CM | POA: Diagnosis not present

## 2018-02-11 DIAGNOSIS — H52223 Regular astigmatism, bilateral: Secondary | ICD-10-CM | POA: Diagnosis not present

## 2018-04-28 DIAGNOSIS — E782 Mixed hyperlipidemia: Secondary | ICD-10-CM | POA: Diagnosis not present

## 2018-04-28 DIAGNOSIS — M810 Age-related osteoporosis without current pathological fracture: Secondary | ICD-10-CM | POA: Diagnosis not present

## 2018-04-28 DIAGNOSIS — Z6826 Body mass index (BMI) 26.0-26.9, adult: Secondary | ICD-10-CM | POA: Diagnosis not present

## 2018-06-16 DIAGNOSIS — N3020 Other chronic cystitis without hematuria: Secondary | ICD-10-CM | POA: Diagnosis not present

## 2018-06-16 DIAGNOSIS — N393 Stress incontinence (female) (male): Secondary | ICD-10-CM | POA: Diagnosis not present

## 2018-07-02 IMAGING — MG NEEDLE LOCALIZATION OF THE LEFT BREAST WITH MAMMO GUIDANCE
3 series · 3 of 3 positions shown · non-contrast
Comparison: Previous exam(s).

CLINICAL DATA: Radioactive seed placement

EXAM:
MAMMOGRAPHIC GUIDED RADIOACTIVE SEED LOCALIZATION OF THE LEFT BREAST

[L CC (1 of 2)]
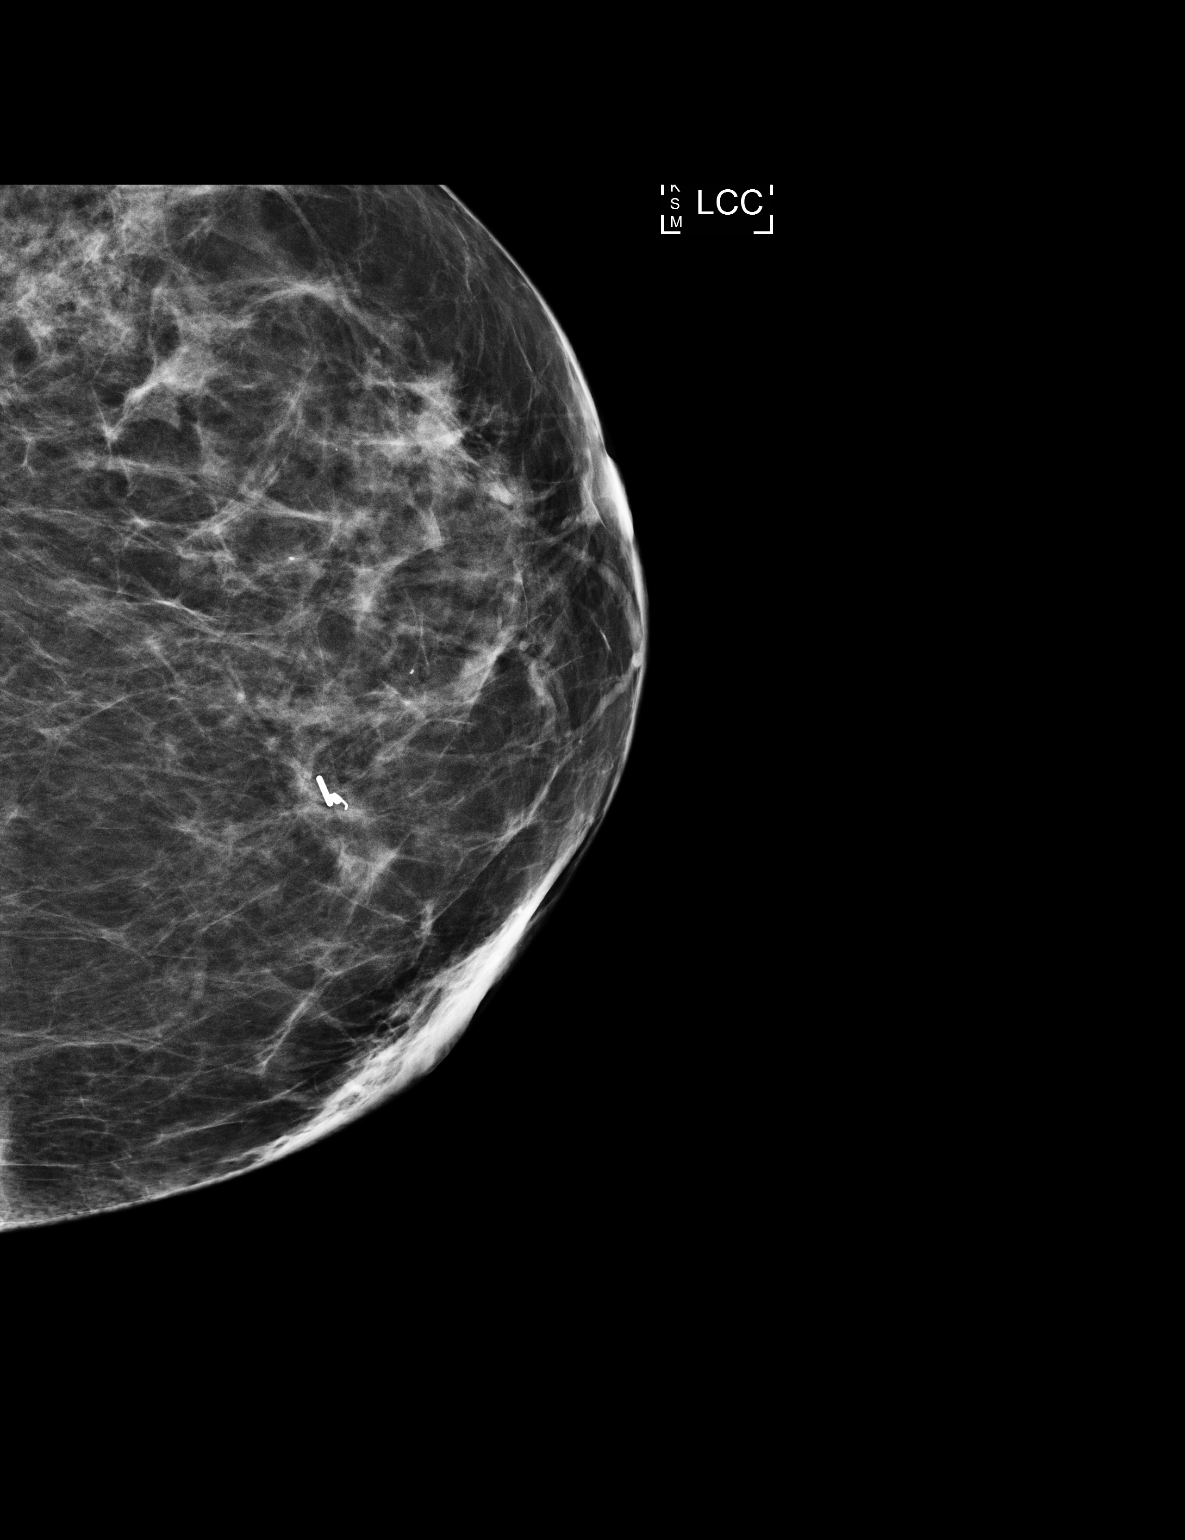

[L ML]
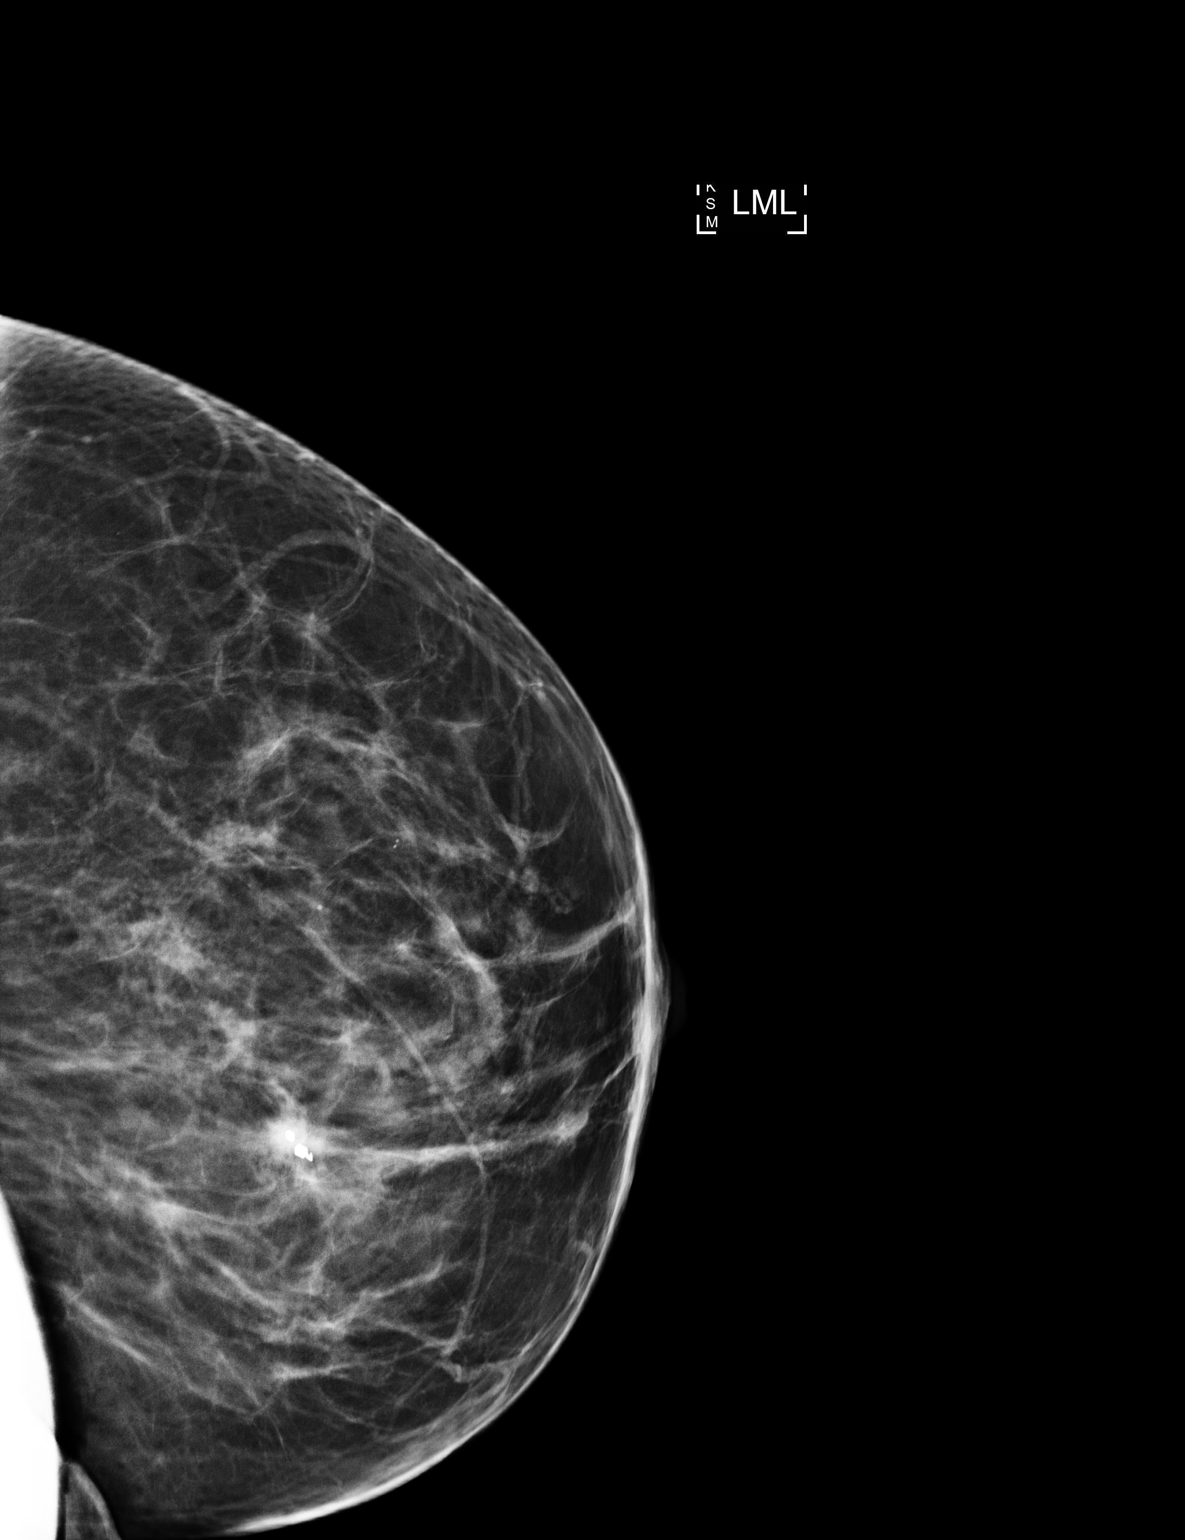

[L CC (2 of 2)]
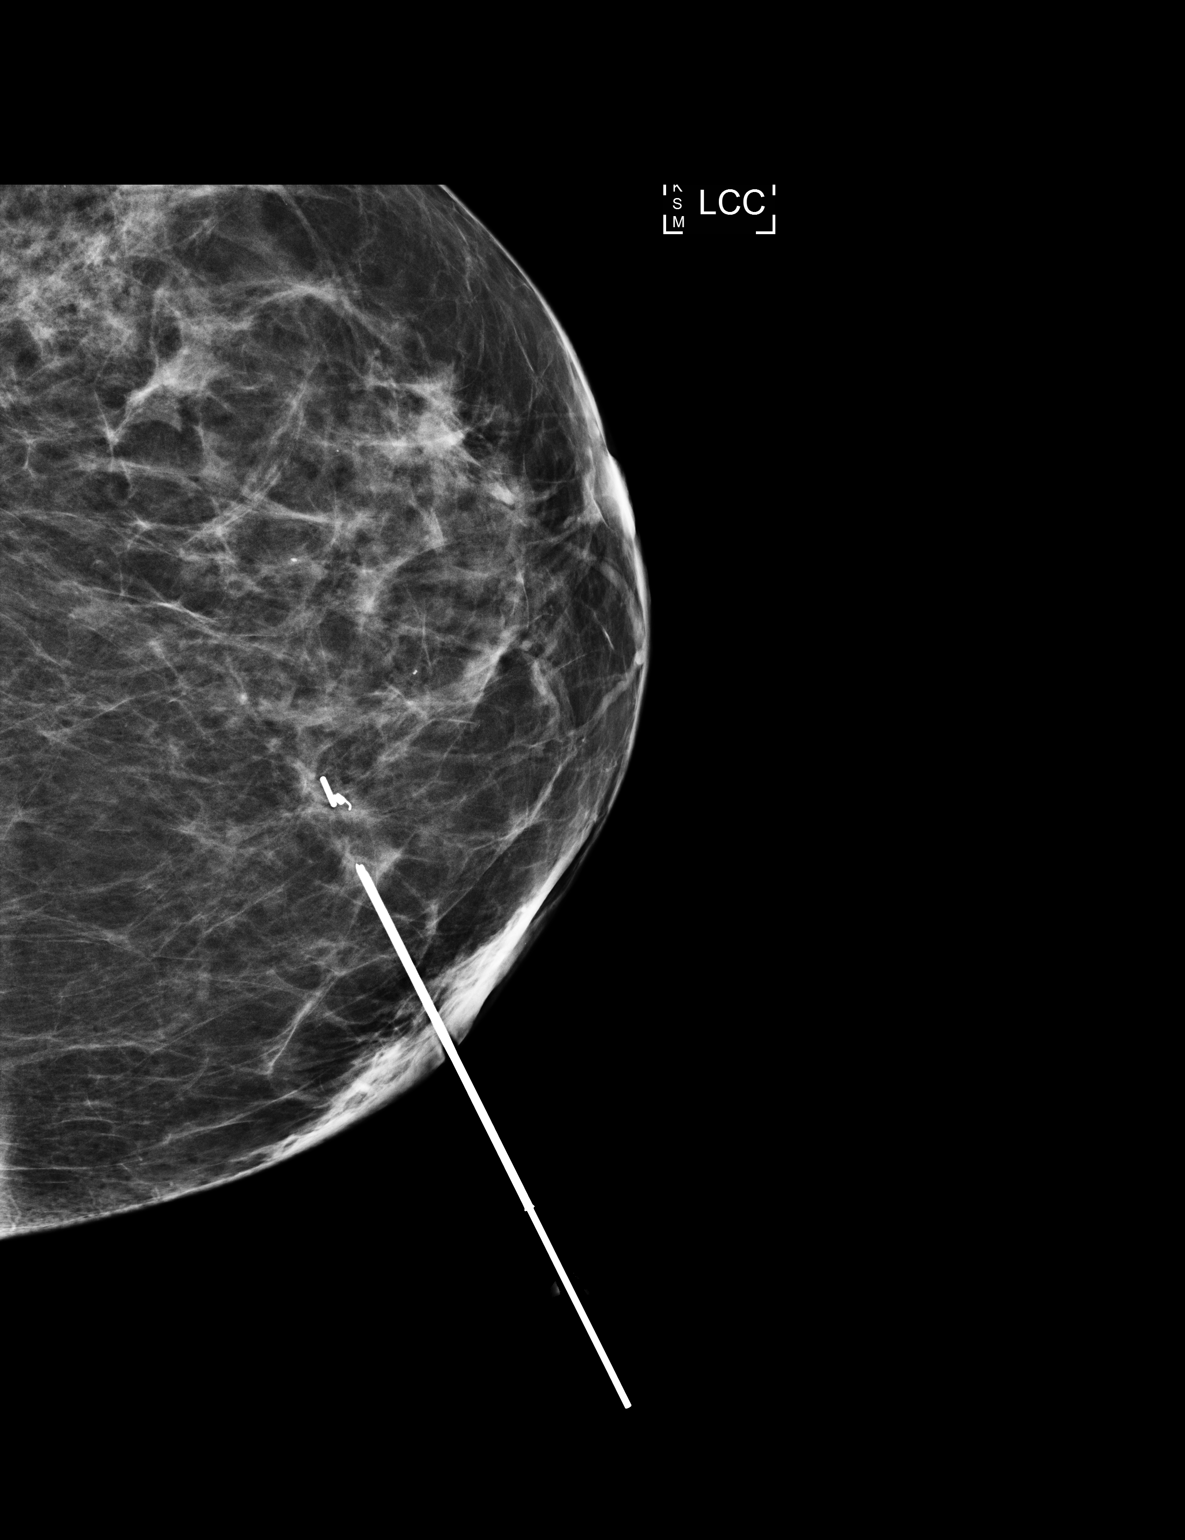

[3 of 3 positions shown; findings below may reference images not displayed]



The usual time-out protocol was performed immediately prior to the
procedure.

Using mammographic guidance, sterile technique, 1% lidocaine and an
3-FIP radioactive seed, the biopsy clip was localized using a medial
approach. The follow-up mammogram images confirm the seed in the
expected location and were marked for the surgeon.

Follow-up survey of the patient confirms presence of the radioactive
seed.

Order number of 3-FIP seed:  [id number].

Total activity:  0.248 millicuries reference Date: September 05, 2017

The patient tolerated the procedure well and was released from the
[REDACTED]. She was given instructions regarding seed removal.
IMPRESSION: Radioactive seed localization left breast. No apparent
complications.

## 2018-07-03 IMAGING — MG BREAST SURGICAL SPECIMEN
1 series · 1 of 1 positions shown · non-contrast
Comparison: Previous exam(s).

CLINICAL DATA: Post left breast excisional biopsy for complex
sclerosing lesion.

EXAM:
SPECIMEN RADIOGRAPH OF THE LEFT BREAST

[L]
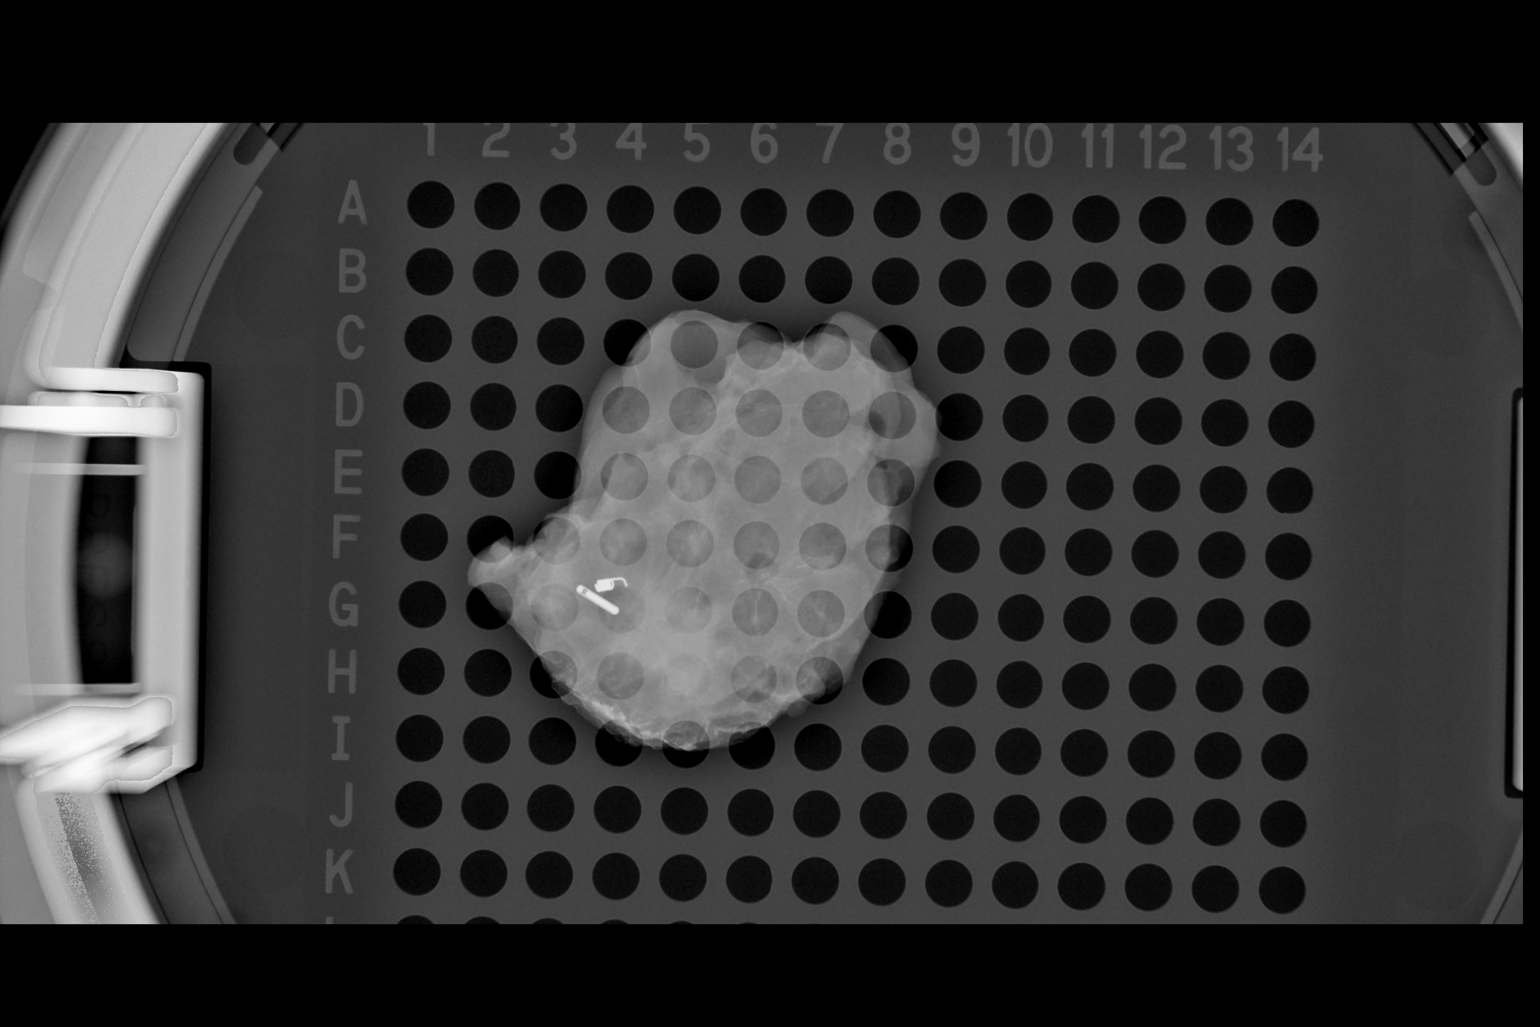

[1 of 1 positions shown; findings below may reference images not displayed]

FINDINGS: Status post excision of the left breast. The radioactive seed and
biopsy marker clip are present, completely intact, and were marked
for pathology.
IMPRESSION: Specimen radiograph of the left breast.

## 2018-10-27 DIAGNOSIS — E782 Mixed hyperlipidemia: Secondary | ICD-10-CM | POA: Diagnosis not present

## 2018-10-27 DIAGNOSIS — Z1159 Encounter for screening for other viral diseases: Secondary | ICD-10-CM | POA: Diagnosis not present

## 2018-10-27 DIAGNOSIS — M810 Age-related osteoporosis without current pathological fracture: Secondary | ICD-10-CM | POA: Diagnosis not present

## 2018-10-27 DIAGNOSIS — Z6826 Body mass index (BMI) 26.0-26.9, adult: Secondary | ICD-10-CM | POA: Diagnosis not present

## 2019-02-02 DIAGNOSIS — R69 Illness, unspecified: Secondary | ICD-10-CM | POA: Diagnosis not present

## 2019-02-16 DIAGNOSIS — Z1231 Encounter for screening mammogram for malignant neoplasm of breast: Secondary | ICD-10-CM | POA: Diagnosis not present

## 2019-02-18 LAB — HM MAMMOGRAPHY: HM Mammogram: NORMAL (ref 0–4)

## 2019-03-09 DIAGNOSIS — R55 Syncope and collapse: Secondary | ICD-10-CM | POA: Diagnosis not present

## 2019-03-09 DIAGNOSIS — M810 Age-related osteoporosis without current pathological fracture: Secondary | ICD-10-CM | POA: Diagnosis not present

## 2019-03-09 DIAGNOSIS — Z1159 Encounter for screening for other viral diseases: Secondary | ICD-10-CM | POA: Diagnosis not present

## 2019-03-09 DIAGNOSIS — E782 Mixed hyperlipidemia: Secondary | ICD-10-CM | POA: Diagnosis not present

## 2019-03-09 DIAGNOSIS — Z20828 Contact with and (suspected) exposure to other viral communicable diseases: Secondary | ICD-10-CM | POA: Diagnosis not present

## 2019-03-09 DIAGNOSIS — J0181 Other acute recurrent sinusitis: Secondary | ICD-10-CM | POA: Diagnosis not present

## 2019-03-09 DIAGNOSIS — Z6827 Body mass index (BMI) 27.0-27.9, adult: Secondary | ICD-10-CM | POA: Diagnosis not present

## 2019-03-10 DIAGNOSIS — R7309 Other abnormal glucose: Secondary | ICD-10-CM | POA: Diagnosis not present

## 2019-04-15 DIAGNOSIS — Z1159 Encounter for screening for other viral diseases: Secondary | ICD-10-CM | POA: Diagnosis not present

## 2019-04-15 DIAGNOSIS — N3001 Acute cystitis with hematuria: Secondary | ICD-10-CM | POA: Diagnosis not present

## 2019-04-15 DIAGNOSIS — N3000 Acute cystitis without hematuria: Secondary | ICD-10-CM | POA: Diagnosis not present

## 2019-04-20 DIAGNOSIS — N3000 Acute cystitis without hematuria: Secondary | ICD-10-CM | POA: Diagnosis not present

## 2019-04-20 DIAGNOSIS — R102 Pelvic and perineal pain: Secondary | ICD-10-CM | POA: Diagnosis not present

## 2019-04-20 DIAGNOSIS — N3020 Other chronic cystitis without hematuria: Secondary | ICD-10-CM | POA: Diagnosis not present

## 2019-05-12 DIAGNOSIS — H52222 Regular astigmatism, left eye: Secondary | ICD-10-CM | POA: Diagnosis not present

## 2019-05-18 DIAGNOSIS — N3281 Overactive bladder: Secondary | ICD-10-CM | POA: Diagnosis not present

## 2019-05-18 DIAGNOSIS — N3946 Mixed incontinence: Secondary | ICD-10-CM | POA: Diagnosis not present

## 2019-06-01 DIAGNOSIS — R1031 Right lower quadrant pain: Secondary | ICD-10-CM | POA: Diagnosis not present

## 2019-06-01 DIAGNOSIS — N3020 Other chronic cystitis without hematuria: Secondary | ICD-10-CM | POA: Diagnosis not present

## 2019-06-01 DIAGNOSIS — N393 Stress incontinence (female) (male): Secondary | ICD-10-CM | POA: Diagnosis not present

## 2019-06-15 DIAGNOSIS — H26491 Other secondary cataract, right eye: Secondary | ICD-10-CM | POA: Diagnosis not present

## 2019-06-23 DIAGNOSIS — D485 Neoplasm of uncertain behavior of skin: Secondary | ICD-10-CM | POA: Diagnosis not present

## 2019-06-23 DIAGNOSIS — L299 Pruritus, unspecified: Secondary | ICD-10-CM | POA: Diagnosis not present

## 2019-06-23 DIAGNOSIS — L280 Lichen simplex chronicus: Secondary | ICD-10-CM | POA: Diagnosis not present

## 2019-06-23 DIAGNOSIS — L309 Dermatitis, unspecified: Secondary | ICD-10-CM | POA: Diagnosis not present

## 2019-07-01 DIAGNOSIS — H43392 Other vitreous opacities, left eye: Secondary | ICD-10-CM | POA: Diagnosis not present

## 2019-07-15 ENCOUNTER — Other Ambulatory Visit: Payer: Self-pay | Admitting: Legal Medicine

## 2019-07-23 ENCOUNTER — Encounter: Payer: Self-pay | Admitting: Legal Medicine

## 2019-07-23 ENCOUNTER — Ambulatory Visit (INDEPENDENT_AMBULATORY_CARE_PROVIDER_SITE_OTHER): Payer: Medicare HMO | Admitting: Legal Medicine

## 2019-07-23 ENCOUNTER — Other Ambulatory Visit: Payer: Self-pay

## 2019-07-23 DIAGNOSIS — J0181 Other acute recurrent sinusitis: Secondary | ICD-10-CM | POA: Diagnosis not present

## 2019-07-23 DIAGNOSIS — M810 Age-related osteoporosis without current pathological fracture: Secondary | ICD-10-CM | POA: Insufficient documentation

## 2019-07-23 DIAGNOSIS — E782 Mixed hyperlipidemia: Secondary | ICD-10-CM

## 2019-07-23 DIAGNOSIS — N6011 Diffuse cystic mastopathy of right breast: Secondary | ICD-10-CM

## 2019-07-23 HISTORY — DX: Age-related osteoporosis without current pathological fracture: M81.0

## 2019-07-23 HISTORY — DX: Diffuse cystic mastopathy of right breast: N60.11

## 2019-07-23 HISTORY — DX: Other acute recurrent sinusitis: J01.81

## 2019-07-23 HISTORY — DX: Mixed hyperlipidemia: E78.2

## 2019-07-23 MED ORDER — CEFDINIR 300 MG PO CAPS: 300.0000 mg | ORAL_CAPSULE | Freq: Two times a day (BID) | ORAL | 0 refills | Status: DC

## 2019-07-23 MED ORDER — TRIAMCINOLONE ACETONIDE 40 MG/ML IJ SUSP
60.0000 mg | Freq: Once | INTRAMUSCULAR | Status: AC
  Administered 2019-07-23: 10:00:00 60 mg via INTRAMUSCULAR

## 2019-07-23 NOTE — Progress Notes (Signed)
Established Patient Office Visit  Subjective:  Patient ID: Kristen Mccarthy, female    DOB: 11-10-1945  Age: 74 y.o. MRN: [id number]  CC:  Chief Complaint  Patient presents with  . Sinusitis    Patient has nasal congestion, sore throat, and facial pressure since 2 days ago.    HPI Kristen Mccarthy presents for acute sinusitis.  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  She got very congested and now having green nasal  Discharge,   She has facial pressure.  Past Medical History:  Diagnosis Date  . Age-related osteoporosis without current pathological fracture 07/23/2019  . Breast mass, left   . Complication of anesthesia   . Diffuse cystic mastopathy of right breast 07/23/2019  . GERD (gastroesophageal reflux disease)   . Hyperlipidemia   . IBS (irritable bowel syndrome)   . Mixed hyperlipidemia 07/23/2019  . Osteoporosis   . Other acute recurrent sinusitis 07/23/2019  . PONV (postoperative nausea and vomiting)     Past Surgical History:  Procedure Laterality Date  . ABDOMINAL HYSTERECTOMY    . BREAST LUMPECTOMY WITH RADIOACTIVE SEED LOCALIZATION Left 10/01/2017   Procedure: BREAST LUMPECTOMY WITH RADIOACTIVE SEED LOCALIZATION;  Surgeon: Coralie Keens, MD;  Location: [place];  Service: General;  Laterality: Left;  . CHOLECYSTECTOMY    . FOOT SURGERY Left   . INCONTINENCE SURGERY      No family history on file.  Social History   Socioeconomic History  . Marital status: Married    Spouse name: Not on file  . Number of children: Not on file  . Years of education: Not on file  . Highest education level: Not on file  Occupational History  . Not on file  Tobacco Use  . Smoking status: Never Smoker  . Smokeless tobacco: Never Used  Substance and Sexual Activity  . Alcohol use: Never  . Drug use: Never  . Sexual activity: Not Currently    Birth control/protection: Surgical  Other Topics Concern  . Not on file  Social History Narrative    . Not on file   Social Determinants of Health   Financial Resource Strain:   . Difficulty of Paying Living Expenses:   Food Insecurity:   . Worried About Charity fundraiser in the Last Year:   . Arboriculturist in the Last Year:   Transportation Needs:   . Film/video editor (Medical):   Marland Kitchen Lack of Transportation (Non-Medical):   Physical Activity:   . Days of Exercise per Week:   . Minutes of Exercise per Session:   Stress:   . Feeling of Stress :   Social Connections:   . Frequency of Communication with Friends and Family:   . Frequency of Social Gatherings with Friends and Family:   . Attends Religious Services:   . Active Member of Clubs or Organizations:   . Attends Archivist Meetings:   Marland Kitchen Marital Status:   Intimate Partner Violence:   . Fear of Current or Ex-Partner:   . Emotionally Abused:   Marland Kitchen Physically Abused:   . Sexually Abused:     Outpatient Medications Prior to Visit  Medication Sig Dispense Refill  . alendronate (FOSAMAX) 70 MG tablet TAKE 1 TABLET ONCE WEEKLY 12 tablet 3  . cholecalciferol (VITAMIN D) 1000 units tablet Take 2,000 Units by mouth daily.    . diazepam (VALIUM) 5 MG tablet Take 5 mg by mouth at bedtime as needed.    Marland Kitchen  magnesium oxide (MAG-OX) 400 (241.3 Mg) MG tablet Take 400 mg by mouth daily.    . nitrofurantoin (MACRODANTIN) 50 MG capsule Take 50 mg by mouth at bedtime.    Marland Kitchen omeprazole (PRILOSEC) 40 MG capsule Take 40 mg by mouth daily.    . polyethylene glycol (MIRALAX / GLYCOLAX) packet Take 17 g by mouth daily.    . rosuvastatin (CRESTOR) 10 MG tablet TAKE 1 TABLET DAILY 90 tablet 2  . calcium-vitamin D (OSCAL WITH D) 500-200 MG-UNIT tablet Take 1 tablet by mouth.    . magnesium oxide (MAG-OX) 400 MG tablet Take by mouth.    . traMADol (ULTRAM) 50 MG tablet Take 1 tablet (50 mg total) by mouth every 6 (six) hours as needed for moderate pain. 20 tablet 0   No facility-administered medications prior to visit.     Allergies  Allergen Reactions  . Sulfa Antibiotics Other (See Comments)    Stomach ache  . Azithromycin Rash  . Penicillins Rash    ROS Review of Systems  Constitutional: Negative.   HENT: Positive for congestion and sinus pain.   Eyes: Negative.   Respiratory: Negative.   Cardiovascular: Negative.   Neurological: Negative.   Psychiatric/Behavioral: Negative.       Objective:    Physical Exam  Constitutional: She appears well-developed and well-nourished.  HENT:  Head: Normocephalic and atraumatic.  Right Ear: External ear normal.  Left Ear: External ear normal.  maxillary sinus pain  Eyes: Pupils are equal, round, and reactive to light. Conjunctivae and EOM are normal.  Cardiovascular: Normal rate, regular rhythm, normal heart sounds and intact distal pulses.  Pulmonary/Chest: Effort normal and breath sounds normal.  Musculoskeletal:     Cervical back: Normal range of motion and neck supple.  Vitals reviewed.   BP 124/60 (BP Location: Right Arm, Patient Position: Sitting)   Pulse 70   Temp 98.3 F (36.8 C) (Temporal)   Resp 17   Ht 5' 2.5" (1.588 m)   Wt 153 lb (69.4 kg)   BMI 27.54 kg/m  Wt Readings from Last 3 Encounters:  07/23/19 153 lb (69.4 kg)  10/01/17 152 lb (68.9 kg)     Health Maintenance Due  Topic Date Due  . Hepatitis C Screening  Never done  . COVID-19 Vaccine (1) Never done  . TETANUS/TDAP  Never done  . MAMMOGRAM  Never done  . COLONOSCOPY  Never done  . DEXA SCAN  Never done  . PNA vac Low Risk Adult (1 of 2 - PCV13) Never done    There are no preventive care reminders to display for this patient.  No results found for: TSH No results found for: WBC, HGB, HCT, MCV, PLT No results found for: NA, K, CHLORIDE, CO2, GLUCOSE, BUN, CREATININE, BILITOT, ALKPHOS, AST, ALT, PROT, ALBUMIN, CALCIUM, ANIONGAP, EGFR, GFR No results found for: CHOL No results found for: HDL No results found for: LDLCALC No results found for: TRIG No  results found for: CHOLHDL No results found for: HGBA1C    Assessment & Plan:  Acute sinusitis  Patient has sinusitis and congestion.  Use cefdirir and kenalog shot.  No orders of the defined types were placed in this encounter.   Follow-up: No follow-ups on file.    Reinaldo Meeker, MD

## 2019-07-25 NOTE — Assessment & Plan Note (Signed)
Patient has sinusitis and congestion.  Use cefdirir and kenalog shot.

## 2019-08-19 ENCOUNTER — Other Ambulatory Visit: Payer: Self-pay

## 2019-08-19 MED ORDER — PROMETHAZINE HCL 12.5 MG PO TABS: 12.5000 mg | ORAL_TABLET | Freq: Four times a day (QID) | ORAL | 2 refills | Status: DC | PRN

## 2019-08-24 ENCOUNTER — Encounter: Payer: Self-pay | Admitting: Legal Medicine

## 2019-08-24 ENCOUNTER — Other Ambulatory Visit: Payer: Self-pay

## 2019-08-24 ENCOUNTER — Ambulatory Visit (INDEPENDENT_AMBULATORY_CARE_PROVIDER_SITE_OTHER): Payer: Medicare HMO | Admitting: Legal Medicine

## 2019-08-24 VITALS — BP 130/60 | HR 74 | Temp 97.6°F | Resp 17 | Ht 62.56 in | Wt 151.0 lb

## 2019-08-24 DIAGNOSIS — B370 Candidal stomatitis: Secondary | ICD-10-CM

## 2019-08-24 DIAGNOSIS — J028 Acute pharyngitis due to other specified organisms: Secondary | ICD-10-CM | POA: Diagnosis not present

## 2019-08-24 DIAGNOSIS — J029 Acute pharyngitis, unspecified: Secondary | ICD-10-CM | POA: Insufficient documentation

## 2019-08-24 MED ORDER — FLUCONAZOLE 100 MG PO TABS: 100.0000 mg | ORAL_TABLET | Freq: Every day | ORAL | 0 refills | Status: DC

## 2019-08-24 NOTE — Progress Notes (Signed)
Acute Office Visit  Subjective:    Patient ID: Kristen Mccarthy, female    DOB: 01/25/1946, 74 y.o.   MRN: [id number]  Chief Complaint  Patient presents with  . Sore Throat    Patient noticed sore throat since last friday and it painful to eat or swalling saliva. Patient denied another symptoms    HPI Patient is in today for sore throat.  She had diarrhea last week and now has severe sore throat.  She was on antibiotics 2 weeks ago.  Past Medical History:  Diagnosis Date  . Age-related osteoporosis without current pathological fracture 07/23/2019  . Breast mass, left   . Complication of anesthesia   . Diffuse cystic mastopathy of right breast 07/23/2019  . GERD (gastroesophageal reflux disease)   . Hyperlipidemia   . IBS (irritable bowel syndrome)   . Mixed hyperlipidemia 07/23/2019  . Osteoporosis   . Other acute recurrent sinusitis 07/23/2019  . PONV (postoperative nausea and vomiting)     Past Surgical History:  Procedure Laterality Date  . ABDOMINAL HYSTERECTOMY    . BREAST LUMPECTOMY WITH RADIOACTIVE SEED LOCALIZATION Left 10/01/2017   Procedure: BREAST LUMPECTOMY WITH RADIOACTIVE SEED LOCALIZATION;  Surgeon: Coralie Keens, MD;  Location: [place];  Service: General;  Laterality: Left;  . CHOLECYSTECTOMY    . FOOT SURGERY Left   . INCONTINENCE SURGERY      History reviewed. No pertinent family history.  Social History   Socioeconomic History  . Marital status: Married    Spouse name: Not on file  . Number of children: Not on file  . Years of education: Not on file  . Highest education level: Not on file  Occupational History  . Not on file  Tobacco Use  . Smoking status: Never Smoker  . Smokeless tobacco: Never Used  Substance and Sexual Activity  . Alcohol use: Never  . Drug use: Never  . Sexual activity: Not Currently    Birth control/protection: Surgical  Other Topics Concern  . Not on file  Social History Narrative  . Not on  file   Social Determinants of Health   Financial Resource Strain:   . Difficulty of Paying Living Expenses:   Food Insecurity:   . Worried About Charity fundraiser in the Last Year:   . Arboriculturist in the Last Year:   Transportation Needs:   . Film/video editor (Medical):   Marland Kitchen Lack of Transportation (Non-Medical):   Physical Activity:   . Days of Exercise per Week:   . Minutes of Exercise per Session:   Stress:   . Feeling of Stress :   Social Connections:   . Frequency of Communication with Friends and Family:   . Frequency of Social Gatherings with Friends and Family:   . Attends Religious Services:   . Active Member of Clubs or Organizations:   . Attends Archivist Meetings:   Marland Kitchen Marital Status:   Intimate Partner Violence:   . Fear of Current or Ex-Partner:   . Emotionally Abused:   Marland Kitchen Physically Abused:   . Sexually Abused:     Outpatient Medications Prior to Visit  Medication Sig Dispense Refill  . alendronate (FOSAMAX) 70 MG tablet TAKE 1 TABLET ONCE WEEKLY 12 tablet 3  . cholecalciferol (VITAMIN D) 1000 units tablet Take 2,000 Units by mouth daily.    . diazepam (VALIUM) 5 MG tablet Take 5 mg by mouth at bedtime as needed.    Marland Kitchen  magnesium oxide (MAG-OX) 400 (241.3 Mg) MG tablet Take 400 mg by mouth daily.    Marland Kitchen omeprazole (PRILOSEC) 40 MG capsule Take 40 mg by mouth daily.    . polyethylene glycol (MIRALAX / GLYCOLAX) packet Take 17 g by mouth daily.    . rosuvastatin (CRESTOR) 10 MG tablet TAKE 1 TABLET DAILY 90 tablet 2  . trimethoprim (TRIMPEX) 100 MG tablet Take 100 mg by mouth daily.    . cefdinir (OMNICEF) 300 MG capsule Take 1 capsule (300 mg total) by mouth 2 (two) times daily. 20 capsule 0  . nitrofurantoin (MACRODANTIN) 50 MG capsule Take 50 mg by mouth at bedtime.    . promethazine (PHENERGAN) 12.5 MG tablet Take 1 tablet (12.5 mg total) by mouth every 6 (six) hours as needed. 30 tablet 2   No facility-administered medications prior to  visit.    Allergies  Allergen Reactions  . Sulfa Antibiotics Other (See Comments)    Stomach ache  . Azithromycin Rash  . Penicillins Rash    Review of Systems  Constitutional: Negative.   Cardiovascular: Negative.   Neurological: Negative.   Psychiatric/Behavioral: Negative.        Objective:    Physical Exam Vitals reviewed.  Constitutional:      Appearance: Normal appearance.  HENT:     Mouth/Throat:     Mouth: Mucous membranes are moist.     Pharynx: Posterior oropharyngeal erythema present.  Cardiovascular:     Rate and Rhythm: Normal rate and regular rhythm.  Pulmonary:     Effort: Pulmonary effort is normal.     Breath sounds: Normal breath sounds.  Musculoskeletal:     Cervical back: Normal range of motion.  Neurological:     Mental Status: She is alert.     BP 130/60 (BP Location: Right Arm, Patient Position: Sitting)   Pulse 74   Temp 97.6 F (36.4 C) (Temporal)   Resp 17   Ht 5' 2.56" (1.589 m)   Wt 151 lb (68.5 kg)   BMI 27.13 kg/m  Wt Readings from Last 3 Encounters:  08/24/19 151 lb (68.5 kg)  07/23/19 153 lb (69.4 kg)  10/01/17 152 lb (68.9 kg)    Health Maintenance Due  Topic Date Due  . Hepatitis C Screening  Never done  . COVID-19 Vaccine (1) Never done  . TETANUS/TDAP  Never done  . MAMMOGRAM  Never done  . COLONOSCOPY  Never done  . DEXA SCAN  Never done  . PNA vac Low Risk Adult (1 of 2 - PCV13) Never done    There are no preventive care reminders to display for this patient.   No results found for: TSH No results found for: WBC, HGB, HCT, MCV, PLT No results found for: NA, K, CHLORIDE, CO2, GLUCOSE, BUN, CREATININE, BILITOT, ALKPHOS, AST, ALT, PROT, ALBUMIN, CALCIUM, ANIONGAP, EGFR, GFR No results found for: CHOL No results found for: HDL No results found for: LDLCALC No results found for: TRIG No results found for: CHOLHDL No results found for: HGBA1C     Assessment & Plan:   Problem List Items Addressed This  Visit      Respiratory   Pharyngitis    Treat with diflucan for one week 156m for candida stomatitis       Other Visit Diagnoses    Candidal stomatitis    -  Primary   Relevant Medications   trimethoprim (TRIMPEX) 100 MG tablet   fluconazole (DIFLUCAN) 100 MG tablet  Meds ordered this encounter  Medications  . fluconazole (DIFLUCAN) 100 MG tablet    Sig: Take 1 tablet (100 mg total) by mouth daily.    Dispense:  7 tablet    Refill:  0     Reinaldo Meeker, MD

## 2019-08-24 NOTE — Assessment & Plan Note (Addendum)
Treat with diflucan for one week 100mg  for candida stomatitis

## 2019-09-07 ENCOUNTER — Ambulatory Visit: Payer: Medicare HMO | Admitting: Legal Medicine

## 2019-09-20 ENCOUNTER — Other Ambulatory Visit: Payer: Self-pay

## 2019-09-20 ENCOUNTER — Encounter: Payer: Self-pay | Admitting: Legal Medicine

## 2019-09-20 ENCOUNTER — Ambulatory Visit (INDEPENDENT_AMBULATORY_CARE_PROVIDER_SITE_OTHER): Payer: Medicare HMO | Admitting: Legal Medicine

## 2019-09-20 VITALS — BP 122/74 | HR 104 | Temp 97.9°F | Resp 16 | Ht 62.5 in | Wt 153.8 lb

## 2019-09-20 DIAGNOSIS — N3001 Acute cystitis with hematuria: Secondary | ICD-10-CM

## 2019-09-20 LAB — POCT URINALYSIS DIPSTICK
Bilirubin, UA: NEGATIVE
Glucose, UA: NEGATIVE
Ketones, UA: NEGATIVE
Nitrite, UA: NEGATIVE
Protein, UA: POSITIVE — AB
Spec Grav, UA: >=1.03 — AB (ref 1.010–1.025)
Urobilinogen, UA: 0.2 E.U./dL
pH, UA: 6 (ref 5.0–8.0)

## 2019-09-20 MED ORDER — NITROFURANTOIN MONOHYD MACRO 100 MG PO CAPS
100.0000 mg | ORAL_CAPSULE | Freq: Two times a day (BID) | ORAL | 0 refills | Status: DC
Start: 2019-09-20 — End: 2020-02-18

## 2019-09-20 NOTE — Progress Notes (Signed)
Acute Office Visit  Subjective:    Patient ID: Kristen Mccarthy, female    DOB: Jul 06, 1945, 74 y.o.   MRN: [id number]  Chief Complaint  Patient presents with   Urinary Tract Infection    HPI Patient is in today for Dysuria and frequency for 3 days.  No fever or chills.  She had full workup with urology last month.  Past Medical History:  Diagnosis Date   Age-related osteoporosis without current pathological fracture 07/23/2019   Breast mass, left    Complication of anesthesia    Diffuse cystic mastopathy of right breast 07/23/2019   GERD (gastroesophageal reflux disease)    Hyperlipidemia    IBS (irritable bowel syndrome)    Mixed hyperlipidemia 07/23/2019   Osteoporosis    Other acute recurrent sinusitis 07/23/2019   PONV (postoperative nausea and vomiting)     Past Surgical History:  Procedure Laterality Date   ABDOMINAL HYSTERECTOMY     BREAST LUMPECTOMY WITH RADIOACTIVE SEED LOCALIZATION Left 10/01/2017   Procedure: BREAST LUMPECTOMY WITH RADIOACTIVE SEED LOCALIZATION;  Surgeon: Coralie Keens, MD;  Location: [place];  Service: General;  Laterality: Left;   CHOLECYSTECTOMY     FOOT SURGERY Left    INCONTINENCE SURGERY      History reviewed. No pertinent family history.  Social History   Socioeconomic History   Marital status: Married    Spouse name: Not on file   Number of children: Not on file   Years of education: Not on file   Highest education level: Not on file  Occupational History   Not on file  Tobacco Use   Smoking status: Never Smoker   Smokeless tobacco: Never Used  Substance and Sexual Activity   Alcohol use: Never   Drug use: Never   Sexual activity: Not Currently    Birth control/protection: Surgical  Other Topics Concern   Not on file  Social History Narrative   Not on file   Social Determinants of Health   Financial Resource Strain:    Difficulty of Paying Living Expenses:   Food  Insecurity:    Worried About Charity fundraiser in the Last Year:    Arboriculturist in the Last Year:   Transportation Needs:    Film/video editor (Medical):    Lack of Transportation (Non-Medical):   Physical Activity:    Days of Exercise per Week:    Minutes of Exercise per Session:   Stress:    Feeling of Stress :   Social Connections:    Frequency of Communication with Friends and Family:    Frequency of Social Gatherings with Friends and Family:    Attends Religious Services:    Active Member of Clubs or Organizations:    Attends Music therapist:    Marital Status:   Intimate Partner Violence:    Fear of Current or Ex-Partner:    Emotionally Abused:    Physically Abused:    Sexually Abused:     Outpatient Medications Prior to Visit  Medication Sig Dispense Refill   alendronate (FOSAMAX) 70 MG tablet TAKE 1 TABLET ONCE WEEKLY 12 tablet 3   cholecalciferol (VITAMIN D) 1000 units tablet Take 2,000 Units by mouth daily.     diazepam (VALIUM) 5 MG tablet Take 5 mg by mouth at bedtime as needed.     fluconazole (DIFLUCAN) 100 MG tablet Take 1 tablet (100 mg total) by mouth daily. 7 tablet 0   magnesium oxide (MAG-OX)  400 (241.3 Mg) MG tablet Take 400 mg by mouth daily.     omeprazole (PRILOSEC) 40 MG capsule Take 40 mg by mouth daily.     polyethylene glycol (MIRALAX / GLYCOLAX) packet Take 17 g by mouth daily.     rosuvastatin (CRESTOR) 10 MG tablet TAKE 1 TABLET DAILY 90 tablet 2   trimethoprim (TRIMPEX) 100 MG tablet Take 100 mg by mouth daily.     No facility-administered medications prior to visit.    Allergies  Allergen Reactions   Sulfa Antibiotics Other (See Comments)    Stomach ache   Azithromycin Rash   Penicillins Rash    Review of Systems  Constitutional: Negative.   HENT: Negative.   Eyes: Negative.   Respiratory: Negative.   Cardiovascular: Negative.   Gastrointestinal: Negative.   Genitourinary:  Positive for difficulty urinating, dysuria and frequency.  Musculoskeletal: Negative.   Neurological: Negative.   Psychiatric/Behavioral: Negative.        Objective:    Physical Exam Vitals reviewed.  Constitutional:      Appearance: Normal appearance.  Eyes:     Extraocular Movements: Extraocular movements intact.     Conjunctiva/sclera: Conjunctivae normal.     Pupils: Pupils are equal, round, and reactive to light.  Cardiovascular:     Rate and Rhythm: Normal rate and regular rhythm.     Pulses: Normal pulses.     Heart sounds: Normal heart sounds.  Pulmonary:     Effort: Pulmonary effort is normal.     Breath sounds: Normal breath sounds.  Abdominal:     Tenderness: There is abdominal tenderness.  Neurological:     Mental Status: She is alert.     BP 122/74    Pulse (!) 104    Temp 97.9 F (36.6 C)    Resp 16    Ht 5' 2.5" (1.588 m)    Wt 153 lb 12.8 oz (69.8 kg)    SpO2 97%    BMI 27.68 kg/m  Wt Readings from Last 3 Encounters:  09/20/19 153 lb 12.8 oz (69.8 kg)  08/24/19 151 lb (68.5 kg)  07/23/19 153 lb (69.4 kg)    Health Maintenance Due  Topic Date Due   Hepatitis C Screening  Never done   COVID-19 Vaccine (1) Never done   TETANUS/TDAP  Never done   MAMMOGRAM  Never done   COLONOSCOPY  Never done   DEXA SCAN  Never done   PNA vac Low Risk Adult (1 of 2 - PCV13) Never done    There are no preventive care reminders to display for this patient.   No results found for: TSH No results found for: WBC, HGB, HCT, MCV, PLT No results found for: NA, K, CHLORIDE, CO2, GLUCOSE, BUN, CREATININE, BILITOT, ALKPHOS, AST, ALT, PROT, ALBUMIN, CALCIUM, ANIONGAP, EGFR, GFR No results found for: CHOL No results found for: HDL No results found for: LDLCALC No results found for: TRIG No results found for: CHOLHDL No results found for: HGBA1C     Assessment & Plan:  1. Acute cystitis with hematuria - POCT urinalysis dipstick - Urine Culture Patient is  continuing to have recurrent UTI despite seeing urology.  We will treat with macrobid and await sensitivity.   No orders of the defined types were placed in this encounter.   Orders Placed This Encounter  Procedures   Urine Culture   POCT urinalysis dipstick     Follow-up: No follow-ups on file.  An After Visit Summary was printed and  given to the patient.  [place] [phone]

## 2019-09-23 ENCOUNTER — Other Ambulatory Visit: Payer: Self-pay | Admitting: Legal Medicine

## 2019-09-23 DIAGNOSIS — N3000 Acute cystitis without hematuria: Secondary | ICD-10-CM

## 2019-09-23 LAB — URINE CULTURE

## 2019-09-23 MED ORDER — CIPROFLOXACIN HCL 500 MG PO TABS
500.0000 mg | ORAL_TABLET | Freq: Two times a day (BID) | ORAL | 0 refills | Status: DC
Start: 2019-09-23 — End: 2019-10-01

## 2019-09-23 NOTE — Progress Notes (Signed)
Urine culture resistant to macrobid, change to cipro lp

## 2019-10-01 ENCOUNTER — Ambulatory Visit (INDEPENDENT_AMBULATORY_CARE_PROVIDER_SITE_OTHER): Payer: Medicare HMO | Admitting: Legal Medicine

## 2019-10-01 ENCOUNTER — Other Ambulatory Visit: Payer: Self-pay

## 2019-10-01 ENCOUNTER — Encounter: Payer: Self-pay | Admitting: Legal Medicine

## 2019-10-01 VITALS — BP 148/74 | HR 78 | Temp 97.3°F | Resp 17 | Ht 62.5 in | Wt 154.4 lb

## 2019-10-01 DIAGNOSIS — M810 Age-related osteoporosis without current pathological fracture: Secondary | ICD-10-CM | POA: Diagnosis not present

## 2019-10-01 DIAGNOSIS — K2100 Gastro-esophageal reflux disease with esophagitis, without bleeding: Secondary | ICD-10-CM | POA: Diagnosis not present

## 2019-10-01 DIAGNOSIS — E782 Mixed hyperlipidemia: Secondary | ICD-10-CM

## 2019-10-01 DIAGNOSIS — K219 Gastro-esophageal reflux disease without esophagitis: Secondary | ICD-10-CM | POA: Insufficient documentation

## 2019-10-01 DIAGNOSIS — Z6827 Body mass index (BMI) 27.0-27.9, adult: Secondary | ICD-10-CM | POA: Diagnosis not present

## 2019-10-01 NOTE — Progress Notes (Signed)
Subjective:  Patient ID: Kristen Mccarthy, female    DOB: September 11, 1945  Age: 74 y.o. MRN: [id number]  Chief Complaint  Patient presents with  . Hyperlipidemia    HPI: chronic visiti  Patient presents with hyperlipidemia.  Compliance with treatment has been good; patient takes medicines as directed, maintains low cholesterol diet, follows up as directed, and maintains exercise regimen.  Patient is using crestor without problems.  Patient has gastroesophageal reflux symptoms withesophagitis and LTRD.  The symptoms are moderate intensity.  Length of symptoms 10 years.  Medicines include omeprazole.  Complications include none.   Current Outpatient Medications on File Prior to Visit  Medication Sig Dispense Refill  . alendronate (FOSAMAX) 70 MG tablet TAKE 1 TABLET ONCE WEEKLY 12 tablet 3  . cholecalciferol (VITAMIN D) 1000 units tablet Take 2,000 Units by mouth daily.    . diazepam (VALIUM) 5 MG tablet Take 5 mg by mouth at bedtime as needed.    . magnesium oxide (MAG-OX) 400 (241.3 Mg) MG tablet Take 400 mg by mouth daily.    . nitrofurantoin, macrocrystal-monohydrate, (MACROBID) 100 MG capsule Take 1 capsule (100 mg total) by mouth 2 (two) times daily. 14 capsule 0  . omeprazole (PRILOSEC) 40 MG capsule Take 40 mg by mouth daily.    . polyethylene glycol (MIRALAX / GLYCOLAX) packet Take 17 g by mouth daily.    . rosuvastatin (CRESTOR) 10 MG tablet TAKE 1 TABLET DAILY 90 tablet 2   No current facility-administered medications on file prior to visit.   Past Medical History:  Diagnosis Date  . Age-related osteoporosis without current pathological fracture 07/23/2019  . Breast mass, left   . Calculus of gallbladder with chronic cholecystitis without obstruction 09/04/2015  . Complication of anesthesia   . Diffuse cystic mastopathy of right breast 07/23/2019  . GERD (gastroesophageal reflux disease)   . Hyperlipidemia   . IBS (irritable bowel syndrome)   . Mixed hyperlipidemia 07/23/2019   . Osteoporosis   . Other acute recurrent sinusitis 07/23/2019  . PONV (postoperative nausea and vomiting)    Past Surgical History:  Procedure Laterality Date  . ABDOMINAL HYSTERECTOMY    . BREAST LUMPECTOMY WITH RADIOACTIVE SEED LOCALIZATION Left 10/01/2017   Procedure: BREAST LUMPECTOMY WITH RADIOACTIVE SEED LOCALIZATION;  Surgeon: Coralie Keens, MD;  Location: [facility];  Service: General;  Laterality: Left;  . CHOLECYSTECTOMY    . FOOT SURGERY Left   . INCONTINENCE SURGERY      History reviewed. No pertinent family history. Social History   Socioeconomic History  . Marital status: Married    Spouse name: Not on file  . Number of children: Not on file  . Years of education: Not on file  . Highest education level: Not on file  Occupational History  . Not on file  Tobacco Use  . Smoking status: Never Smoker  . Smokeless tobacco: Never Used  Substance and Sexual Activity  . Alcohol use: Never  . Drug use: Never  . Sexual activity: Not Currently    Birth control/protection: Surgical  Other Topics Concern  . Not on file  Social History Narrative  . Not on file   Social Determinants of Health   Financial Resource Strain:   . Difficulty of Paying Living Expenses:   Food Insecurity:   . Worried About Charity fundraiser in the Last Year:   . Arboriculturist in the Last Year:   Transportation Needs:   . Film/video editor (Medical):   Marland Kitchen  Lack of Transportation (Non-Medical):   Physical Activity:   . Days of Exercise per Week:   . Minutes of Exercise per Session:   Stress:   . Feeling of Stress :   Social Connections:   . Frequency of Communication with Friends and Family:   . Frequency of Social Gatherings with Friends and Family:   . Attends Religious Services:   . Active Member of Clubs or Organizations:   . Attends Archivist Meetings:   Marland Kitchen Marital Status:     Review of Systems  Constitutional: Negative.   HENT: Negative.     Eyes: Negative.   Respiratory: Negative.   Cardiovascular: Negative.   Gastrointestinal: Negative.   Genitourinary: Negative.   Musculoskeletal: Negative.   Skin: Negative.   Neurological: Negative.   Psychiatric/Behavioral: Negative.      Objective:  BP (!) 148/74 (BP Location: Left Arm, Patient Position: Sitting)   Pulse 78   Temp (!) 97.3 F (36.3 C) (Temporal)   Resp 17   Ht 5' 2.5" (1.588 m)   Wt 154 lb 6.4 oz (70 kg)   SpO2 98%   BMI 27.79 kg/m   BP/Weight 10/01/2019 09/20/2019 08/26/7822  Systolic BP 235 361 443  Diastolic BP 74 74 60  Wt. (Lbs) 154.4 153.8 151  BMI 27.79 27.68 27.13    Physical Exam Vitals reviewed.  Constitutional:      Appearance: Normal appearance.  HENT:     Head: Normocephalic and atraumatic.     Right Ear: Tympanic membrane, ear canal and external ear normal.     Left Ear: Tympanic membrane, ear canal and external ear normal.     Nose: Nose normal.     Mouth/Throat:     Mouth: Mucous membranes are dry.  Eyes:     Extraocular Movements: Extraocular movements intact.     Conjunctiva/sclera: Conjunctivae normal.     Pupils: Pupils are equal, round, and reactive to light.  Cardiovascular:     Rate and Rhythm: Normal rate and regular rhythm.     Pulses: Normal pulses.     Heart sounds: Normal heart sounds.  Pulmonary:     Effort: Pulmonary effort is normal.     Breath sounds: Normal breath sounds.  Abdominal:     General: Abdomen is flat.     Palpations: Abdomen is soft.  Musculoskeletal:        General: Normal range of motion.     Cervical back: Normal range of motion and neck supple.  Skin:    General: Skin is warm.     Capillary Refill: Capillary refill takes less than 2 seconds.  Neurological:     General: No focal deficit present.     Mental Status: She is alert and oriented to person, place, and time. Mental status is at baseline.       No results found for: WBC, HGB, HCT, PLT, GLUCOSE, CHOL, TRIG, HDL, LDLDIRECT,  LDLCALC, ALT, AST, NA, K, CL, CREATININE, BUN, CO2, TSH, PSA, INR, GLUF, HGBA1C, MICROALBUR    Assessment & Plan:   1. Mixed hyperlipidemia - CBC with Differential/Platelet - Comprehensive metabolic panel - Lipid panel AN INDIVIDUAL CARE PLAN for hyperlipidemia/ cholesterol was established and reinforced today.  The patient's status was assessed using clinical findings on exam, lab and other diagnostic tests. The patient's disease status was assessed based on evidence-based guidelines and found to be well controlled. MEDICATIONS were reviewed. SELF MANAGEMENT GOALS have been discussed and patient's success at attaining the goal  of low cholesterol was assessed. RECOMMENDATION given include regular exercise 3 days a week and low cholesterol/low fat diet. CLINICAL SUMMARY including written plan to identify barriers unique to the patient due to social or economic  reasons was discussed.  2. Age-related osteoporosis without current pathological fracture AN INDIVIDUAL CARE PLAN for osteoporosis was established and reinforced today.  The patient's status was assessed using clinical findings on exam, labs, and other diagnostic testing. Patient's success at meeting treatment goals based on disease specific evidence-bassed guidelines and found to be in good control. RECOMMENDATIONS include maintaining present medicines and treatment.  3. Gastroesophageal reflux disease with esophagitis without hemorrhage Plan of care was formulated today.  She is doing well.  A plan of care was formulated using patient exam, tests and other sources to optimize care using evidence based information.  Recommend no smoking, no eating after supper, avoid fatty foods, elevate Head of bed, avoid tight fitting clothing.  Continue on omeprazole.   4. BMI 27-27.9 An individualize plan was formulated for obesity using patient history and physical exam to encourage weight loss.  An evidence based program was formulated.  Patient  is to cut portionsize with meals and to plan physical exercise 3 days a week at least 20 minutes.  Weight watchers and other programs are helpful.  Planned amount of weight loss 5 lbs.   Orders Placed This Encounter  Procedures  . CBC with Differential/Platelet  . Comprehensive metabolic panel  . Lipid panel     Follow-up: Return in about 6 months (around 04/02/2020), or mcr pe kim, for fasting.  An After Visit Summary was printed and given to the patient.  [place] [phone]

## 2019-10-04 DIAGNOSIS — Z6825 Body mass index (BMI) 25.0-25.9, adult: Secondary | ICD-10-CM | POA: Insufficient documentation

## 2019-10-05 NOTE — Progress Notes (Signed)
CBC normal, glucose 104, calcium high do not take any extra calcium for now, kidney and liver tests ok, Cholesterol ok lp

## 2019-10-06 LAB — CBC WITH DIFFERENTIAL/PLATELET
Basophils Absolute: 0 10*3/uL (ref 0.0–0.2)
Basos: 0 %
EOS (ABSOLUTE): 0.1 10*3/uL (ref 0.0–0.4)
Eos: 1 %
Hematocrit: 44.2 % (ref 34.0–46.6)
Hemoglobin: 15.4 g/dL (ref 11.1–15.9)
Immature Grans (Abs): 0 10*3/uL (ref 0.0–0.1)
Immature Granulocytes: 0 %
Lymphocytes Absolute: 2.2 10*3/uL (ref 0.7–3.1)
Lymphs: 30 %
MCH: 32.2 pg (ref 26.6–33.0)
MCHC: 34.8 g/dL (ref 31.5–35.7)
MCV: 93 fL (ref 79–97)
Monocytes Absolute: 0.7 10*3/uL (ref 0.1–0.9)
Monocytes: 9 %
Neutrophils Absolute: 4.4 10*3/uL (ref 1.4–7.0)
Neutrophils: 60 %
Platelets: 241 10*3/uL (ref 150–450)
RBC: 4.78 x10E6/uL (ref 3.77–5.28)
RDW: 12 % (ref 11.7–15.4)
WBC: 7.3 10*3/uL (ref 3.4–10.8)

## 2019-10-06 LAB — COMPREHENSIVE METABOLIC PANEL
ALT: 28 IU/L (ref 0–32)
AST: 23 IU/L (ref 0–40)
Albumin/Globulin Ratio: 2 (ref 1.2–2.2)
Albumin: 4.8 g/dL — ABNORMAL HIGH (ref 3.7–4.7)
Alkaline Phosphatase: 68 IU/L (ref 48–121)
BUN/Creatinine Ratio: 13 (ref 12–28)
BUN: 10 mg/dL (ref 8–27)
Bilirubin Total: 0.5 mg/dL (ref 0.0–1.2)
CO2: 21 mmol/L (ref 20–29)
Calcium: 10.4 mg/dL — ABNORMAL HIGH (ref 8.7–10.3)
Chloride: 104 mmol/L (ref 96–106)
Creatinine, Ser: 0.76 mg/dL (ref 0.57–1.00)
GFR calc Af Amer: 89 mL/min/{1.73_m2} (ref >59)
GFR calc non Af Amer: 78 mL/min/{1.73_m2} (ref >59)
Globulin, Total: 2.4 g/dL (ref 1.5–4.5)
Glucose: 104 mg/dL — ABNORMAL HIGH (ref 65–99)
Potassium: 3.9 mmol/L (ref 3.5–5.2)
Sodium: 143 mmol/L (ref 134–144)
Total Protein: 7.2 g/dL (ref 6.0–8.5)

## 2019-10-06 LAB — LIPID PANEL
Chol/HDL Ratio: 3 ratio (ref 0.0–4.4)
Cholesterol, Total: 160 mg/dL (ref 100–199)
HDL: 54 mg/dL (ref >39)
LDL Chol Calc (NIH): 88 mg/dL (ref 0–99)
Triglycerides: 99 mg/dL (ref 0–149)
VLDL Cholesterol Cal: 18 mg/dL (ref 5–40)

## 2019-10-06 LAB — CARDIOVASCULAR RISK ASSESSMENT

## 2019-12-07 ENCOUNTER — Other Ambulatory Visit: Payer: Self-pay

## 2019-12-07 MED ORDER — OMEPRAZOLE 40 MG PO CPDR: 40.0000 mg | DELAYED_RELEASE_CAPSULE | Freq: Every day | ORAL | 2 refills | Status: DC

## 2019-12-16 ENCOUNTER — Ambulatory Visit: Payer: Medicare HMO | Admitting: Family Medicine

## 2019-12-16 ENCOUNTER — Encounter: Payer: Self-pay | Admitting: Legal Medicine

## 2019-12-16 ENCOUNTER — Ambulatory Visit (INDEPENDENT_AMBULATORY_CARE_PROVIDER_SITE_OTHER): Payer: Medicare HMO | Admitting: Legal Medicine

## 2019-12-16 ENCOUNTER — Ambulatory Visit: Payer: Medicare HMO

## 2019-12-16 ENCOUNTER — Other Ambulatory Visit: Payer: Self-pay

## 2019-12-16 VITALS — BP 140/70 | HR 81 | Temp 97.7°F | Resp 17 | Ht 62.5 in | Wt 154.2 lb

## 2019-12-16 DIAGNOSIS — Z1231 Encounter for screening mammogram for malignant neoplasm of breast: Secondary | ICD-10-CM

## 2019-12-16 DIAGNOSIS — M8588 Other specified disorders of bone density and structure, other site: Secondary | ICD-10-CM

## 2019-12-16 DIAGNOSIS — Z0000 Encounter for general adult medical examination without abnormal findings: Secondary | ICD-10-CM

## 2019-12-16 NOTE — Patient Instructions (Signed)
  Kristen Mccarthy , Thank you for taking time to come for your Medicare Wellness Visit. I appreciate your ongoing commitment to your health goals. Please review the following plan we discussed and let me know if I can assist you in the future.   These are the goals we discussed: Goals    . DIET - REDUCE PORTION SIZE       This is a list of the screening recommended for you and due dates:  Health Maintenance  Topic Date Due  .  Hepatitis C: One time screening is recommended by Center for Disease Control  (CDC) for  adults born from [birth year] through 1966.   Never done  . Colon Cancer Screening  Never done  . DEXA scan (bone density measurement)  Never done  . COVID-19 Vaccine (1) 01/01/2020*  . Flu Shot  06/29/2020*  . Tetanus Vaccine  12/15/2020*  . Pneumonia vaccines (1 of 2 - PCV13) 12/15/2020*  *Topic was postponed. The date shown is not the original due date.

## 2019-12-16 NOTE — Progress Notes (Signed)
Subjective:  Patient ID: Kristen Mccarthy, female    DOB: [birth year]  Age: 74 y.o. MRN: [id number]  Chief Complaint  Patient presents with  . Annual Exam    Patient is here for AWV    HPI Encounter for general adult medical examination without abnormal findings  Physical ("At Risk" items are starred): Patient's last physical exam was 1 year ago .  Weight: Appropriate for height (BMI less than 27%) ;  Blood Pressure: Normal (BP less than 140/80) ;  Medical History: Patient history reviewed ; Family history reviewed ;  Allergies Reviewed: No change in current allergies ;  Medications Reviewed: Medications reviewed - no changes ;  Lipids: Normal lipid levels ;  Smoking: Life-long non-smoker ;  Physical Activity: Exercises at least 3 times per week ;  Alcohol/Drug Use: Is a non-drinker ; No illicit drug use ;  Patient is not afflicted from Stress Incontinence and Urge Incontinence  Safety: reviewed ; Patient wears a seat belt, has smoke detectors, has carbon monoxide detectors, practices appropriate gun safety, and wears sunscreen with extended sun exposure. Dental Care: biannual cleanings, brushes and flosses daily. Ophthalmology/Optometry: Annual visit.  Hearing loss: none Vision impairments: no  Needs dexa scan Mammogram set up for november  Fall Risk  12/16/2019 08/24/2019  Falls in the past year? 0 0  Number falls in past yr: 0 0  Injury with Fall? 0 0  Follow up Falls evaluation completed Falls evaluation completed     Depression screen [facility] 2/9 12/16/2019 07/23/2019  Decreased Interest 0 0  Down, Depressed, Hopeless 0 0  PHQ - 2 Score 0 0       Functional Status Survey: Does the patient have difficulty seeing, even when wearing glasses/contacts?: No Does the patient have difficulty concentrating, remembering, or making decisions?: No Does the patient have difficulty walking or climbing stairs?: No Does the patient have difficulty dressing or bathing?: No Does the  patient have difficulty doing errands alone such as visiting a doctor's office or shopping?: No   Social Hx   Social History   Socioeconomic History  . Marital status: Married    Spouse name: Not on file  . Number of children: Not on file  . Years of education: Not on file  . Highest education level: Not on file  Occupational History  . Not on file  Tobacco Use  . Smoking status: Never Smoker  . Smokeless tobacco: Never Used  Substance and Sexual Activity  . Alcohol use: Never  . Drug use: Never  . Sexual activity: Not Currently    Birth control/protection: Surgical  Other Topics Concern  . Not on file  Social History Narrative  . Not on file   Social Determinants of Health   Financial Resource Strain:   . Difficulty of Paying Living Expenses: Not on file  Food Insecurity:   . Worried About Charity fundraiser in the Last Year: Not on file  . Ran Out of Food in the Last Year: Not on file  Transportation Needs:   . Lack of Transportation (Medical): Not on file  . Lack of Transportation (Non-Medical): Not on file  Physical Activity:   . Days of Exercise per Week: Not on file  . Minutes of Exercise per Session: Not on file  Stress:   . Feeling of Stress : Not on file  Social Connections:   . Frequency of Communication with Friends and Family: Not on file  . Frequency of Social Gatherings with  Friends and Family: Not on file  . Attends Religious Services: Not on file  . Active Member of Clubs or Organizations: Not on file  . Attends Archivist Meetings: Not on file  . Marital Status: Not on file   Past Medical History:  Diagnosis Date  . Age-related osteoporosis without current pathological fracture 07/23/2019  . Breast mass, left   . Calculus of gallbladder with chronic cholecystitis without obstruction 09/04/2015  . Complication of anesthesia   . Diffuse cystic mastopathy of right breast 07/23/2019  . GERD (gastroesophageal reflux disease)   .  Hyperlipidemia   . IBS (irritable bowel syndrome)   . Mixed hyperlipidemia 07/23/2019  . Osteoporosis   . Other acute recurrent sinusitis 07/23/2019  . PONV (postoperative nausea and vomiting)    History reviewed. No pertinent family history.  Review of Systems  Constitutional: Negative.   HENT: Negative.        Still has taste problems from covid  Eyes: Negative.   Respiratory: Negative.   Cardiovascular: Negative.   Gastrointestinal: Negative.   Endocrine: Negative.   Genitourinary: Negative.   Musculoskeletal: Negative.   Neurological: Negative.   Psychiatric/Behavioral: Negative.      Objective:  BP 140/70 (BP Location: Right Arm, Patient Position: Sitting)   Pulse 81   Temp 97.7 F (36.5 C) (Temporal)   Resp 17   Ht 5' 2.5" (1.588 m)   Wt 154 lb 3.2 oz (69.9 kg)   SpO2 97%   BMI 27.75 kg/m   BP/Weight 12/16/2019 10/01/2019 10/11/4578  Systolic BP 998 338 250  Diastolic BP 70 74 74  Wt. (Lbs) 154.2 154.4 153.8  BMI 27.75 27.79 27.68    Physical Exam  Lab Results  Component Value Date   WBC 7.3 10/01/2019   HGB 15.4 10/01/2019   HCT 44.2 10/01/2019   PLT 241 10/01/2019   GLUCOSE 104 (H) 10/01/2019   CHOL 160 10/01/2019   TRIG 99 10/01/2019   HDL 54 10/01/2019   LDLCALC 88 10/01/2019   ALT 28 10/01/2019   AST 23 10/01/2019   NA 143 10/01/2019   K 3.9 10/01/2019   CL 104 10/01/2019   CREATININE 0.76 10/01/2019   BUN 10 10/01/2019   CO2 21 10/01/2019      Assessment & Plan:  1. Osteopenia of lumbar spine - DG Bone Density Patient needs another DEXA scan, last in 2019  2. Encounter for screening mammogram for malignant neoplasm of breast - MM Digital Screening Patient will need another mammogram in November  3. Routine general medical examination at a health care facility Routine wellness instructions Patient refuses all immunizations      These are the goals we discussed: Goals    . DIET - REDUCE PORTION SIZE        This is a list  of the screening recommended for you and due dates:  Health Maintenance  Topic Date Due  .  Hepatitis C: One time screening is recommended by Center for Disease Control  (CDC) for  adults born from [birth year] through 1966.   Never done  . Colon Cancer Screening  Never done  . DEXA scan (bone density measurement)  Never done  . COVID-19 Vaccine (1) 01/01/2020*  . Flu Shot  06/29/2020*  . Tetanus Vaccine  12/15/2020*  . Pneumonia vaccines (1 of 2 - PCV13) 12/15/2020*  *Topic was postponed. The date shown is not the original due date.     AN INDIVIDUALIZED CARE PLAN: was established or reinforced  today.   SELF MANAGEMENT: The patient and I together assessed ways to personally work towards obtaining the recommended goals  Support needs The patient and/or family needs were assessed and services were offered and not necessary at this time.    Follow-up: Return if symptoms worsen or fail to improve. [place] [phone]

## 2020-01-19 DIAGNOSIS — K219 Gastro-esophageal reflux disease without esophagitis: Secondary | ICD-10-CM | POA: Diagnosis not present

## 2020-01-19 DIAGNOSIS — R030 Elevated blood-pressure reading, without diagnosis of hypertension: Secondary | ICD-10-CM | POA: Diagnosis not present

## 2020-01-19 DIAGNOSIS — M1990 Unspecified osteoarthritis, unspecified site: Secondary | ICD-10-CM | POA: Diagnosis not present

## 2020-01-19 DIAGNOSIS — E785 Hyperlipidemia, unspecified: Secondary | ICD-10-CM | POA: Diagnosis not present

## 2020-01-19 DIAGNOSIS — N390 Urinary tract infection, site not specified: Secondary | ICD-10-CM | POA: Diagnosis not present

## 2020-01-19 DIAGNOSIS — M810 Age-related osteoporosis without current pathological fracture: Secondary | ICD-10-CM | POA: Diagnosis not present

## 2020-01-19 DIAGNOSIS — K5900 Constipation, unspecified: Secondary | ICD-10-CM | POA: Diagnosis not present

## 2020-01-19 DIAGNOSIS — Z880 Allergy status to penicillin: Secondary | ICD-10-CM | POA: Diagnosis not present

## 2020-01-19 DIAGNOSIS — Z7983 Long term (current) use of bisphosphonates: Secondary | ICD-10-CM | POA: Diagnosis not present

## 2020-01-19 DIAGNOSIS — Z8249 Family history of ischemic heart disease and other diseases of the circulatory system: Secondary | ICD-10-CM | POA: Diagnosis not present

## 2020-01-25 DIAGNOSIS — R3915 Urgency of urination: Secondary | ICD-10-CM | POA: Diagnosis not present

## 2020-01-25 DIAGNOSIS — N3090 Cystitis, unspecified without hematuria: Secondary | ICD-10-CM | POA: Diagnosis not present

## 2020-02-09 ENCOUNTER — Other Ambulatory Visit: Payer: Self-pay | Admitting: Legal Medicine

## 2020-02-10 DIAGNOSIS — Z969 Presence of functional implant, unspecified: Secondary | ICD-10-CM | POA: Insufficient documentation

## 2020-02-17 DIAGNOSIS — R69 Illness, unspecified: Secondary | ICD-10-CM | POA: Diagnosis not present

## 2020-02-18 ENCOUNTER — Telehealth (INDEPENDENT_AMBULATORY_CARE_PROVIDER_SITE_OTHER): Payer: Medicare HMO | Admitting: Nurse Practitioner

## 2020-02-18 ENCOUNTER — Encounter: Payer: Self-pay | Admitting: Nurse Practitioner

## 2020-02-18 VITALS — BP 137/80 | HR 82 | Temp 97.5°F | Ht 62.5 in | Wt 144.0 lb

## 2020-02-18 DIAGNOSIS — J310 Chronic rhinitis: Secondary | ICD-10-CM

## 2020-02-18 DIAGNOSIS — Z8709 Personal history of other diseases of the respiratory system: Secondary | ICD-10-CM

## 2020-02-18 DIAGNOSIS — J329 Chronic sinusitis, unspecified: Secondary | ICD-10-CM | POA: Diagnosis not present

## 2020-02-18 NOTE — Progress Notes (Signed)
Virtual Visit via Telephone Note   This visit type was conducted due to national recommendations for restrictions regarding the COVID-19 Pandemic (e.g. social distancing) in an effort to limit this patient's exposure and mitigate transmission in our community.  Due to her co-morbid illnesses, this patient is at least at moderate risk for complications without adequate follow up.  This format is felt to be most appropriate for this patient at this time.  The patient did not have access to video technology/had technical difficulties with video requiring transitioning to audio format only (telephone).  All issues noted in this document were discussed and addressed.  No physical exam could be performed with this format.  Patient verbally consented to a telehealth visit  Date:  02/18/2020   ID:  Kristen Mccarthy, Kristen Mccarthy 1946/03/11, MRN [id number]  Patient Location: Home Provider Location: Office/Clinic  PCP:  Lillard Anes, MD   Evaluation Performed:  Established patient, acute visit  Chief Complaint: Hoarseness and nasal drainage  History of Present Illness:    Kristen Mccarthy is a 74 y.o. female with hoarseness, nasal congestion, and rhinorrhea. Denies fever, chills, headache, sore throat, malaise, or cough. Onset was 1-day ago. Treatment includes Zyrtec. She had a root canal yesterday. States she was around her 55-year-old grandson 3-days ago that has a minor upper respiratory infection, per patient was told it was a minor cold by pediatrician. Medical history includes recurrent sinusitis, hyperlipidemia, osteoporosis, and GERD.  The patient does have symptoms concerning for COVID-19 infection (fever, chills, cough, or new shortness of breath).    Past Medical History:  Diagnosis Date  . Age-related osteoporosis without current pathological fracture 07/23/2019  . Breast mass, left   . Calculus of gallbladder with chronic cholecystitis without obstruction 09/04/2015  .  Complication of anesthesia   . Diffuse cystic mastopathy of right breast 07/23/2019  . GERD (gastroesophageal reflux disease)   . Hyperlipidemia   . IBS (irritable bowel syndrome)   . Mixed hyperlipidemia 07/23/2019  . Osteoporosis   . Other acute recurrent sinusitis 07/23/2019  . PONV (postoperative nausea and vomiting)     Past Surgical History:  Procedure Laterality Date  . ABDOMINAL HYSTERECTOMY    . BREAST LUMPECTOMY WITH RADIOACTIVE SEED LOCALIZATION Left 10/01/2017   Procedure: BREAST LUMPECTOMY WITH RADIOACTIVE SEED LOCALIZATION;  Surgeon: Coralie Keens, MD;  Location: [facility];  Service: General;  Laterality: Left;  . CHOLECYSTECTOMY    . FOOT SURGERY Left   . INCONTINENCE SURGERY      History reviewed. No pertinent family history.  Social History   Socioeconomic History  . Marital status: Married    Spouse name: Not on file  . Number of children: Not on file  . Years of education: Not on file  . Highest education level: Not on file  Occupational History  . Not on file  Tobacco Use  . Smoking status: Never Smoker  . Smokeless tobacco: Never Used  Substance and Sexual Activity  . Alcohol use: Never  . Drug use: Never  . Sexual activity: Not Currently    Birth control/protection: Surgical  Other Topics Concern  . Not on file  Social History Narrative  . Not on file   Social Determinants of Health   Financial Resource Strain:   . Difficulty of Paying Living Expenses: Not on file  Food Insecurity:   . Worried About Charity fundraiser in the Last Year: Not on file  . Ran Out of Food in the  Last Year: Not on file  Transportation Needs:   . Lack of Transportation (Medical): Not on file  . Lack of Transportation (Non-Medical): Not on file  Physical Activity:   . Days of Exercise per Week: Not on file  . Minutes of Exercise per Session: Not on file  Stress:   . Feeling of Stress : Not on file  Social Connections:   . Frequency of  Communication with Friends and Family: Not on file  . Frequency of Social Gatherings with Friends and Family: Not on file  . Attends Religious Services: Not on file  . Active Member of Clubs or Organizations: Not on file  . Attends Archivist Meetings: Not on file  . Marital Status: Not on file  Intimate Partner Violence: Not At Risk  . Fear of Current or Ex-Partner: No  . Emotionally Abused: No  . Physically Abused: No  . Sexually Abused: No    Outpatient Medications Prior to Visit  Medication Sig Dispense Refill  . alendronate (FOSAMAX) 70 MG tablet TAKE 1 TABLET ONCE WEEKLY 12 tablet 3  . cholecalciferol (VITAMIN D) 1000 units tablet Take 2,000 Units by mouth daily.    . diazepam (VALIUM) 5 MG tablet Take 5 mg by mouth at bedtime as needed.    . magnesium oxide (MAG-OX) 400 (241.3 Mg) MG tablet Take 400 mg by mouth daily.    Marland Kitchen omeprazole (PRILOSEC) 40 MG capsule Take 1 capsule (40 mg total) by mouth daily. 90 capsule 2  . polyethylene glycol (MIRALAX / GLYCOLAX) packet Take 17 g by mouth daily.    . rosuvastatin (CRESTOR) 10 MG tablet TAKE 1 TABLET DAILY 90 tablet 2  . nitrofurantoin (MACRODANTIN) 50 MG capsule Take 50 mg by mouth daily.    . nitrofurantoin, macrocrystal-monohydrate, (MACROBID) 100 MG capsule Take 1 capsule (100 mg total) by mouth 2 (two) times daily. 14 capsule 0   No facility-administered medications prior to visit.    Allergies:   Sulfa antibiotics, Azithromycin, and Penicillins   Social History   Tobacco Use  . Smoking status: Never Smoker  . Smokeless tobacco: Never Used  Substance Use Topics  . Alcohol use: Never  . Drug use: Never     Review of Systems  Constitutional: Negative for chills, fever and malaise/fatigue.  HENT: Positive for congestion and sinus pain. Negative for ear pain and sore throat.   Respiratory: Negative for cough, shortness of breath and wheezing.   Cardiovascular: Negative for chest pain and palpitations.    Gastrointestinal: Negative for abdominal pain, constipation, diarrhea, nausea and vomiting.  Genitourinary: Negative for frequency and urgency.  Musculoskeletal: Negative for back pain, joint pain and myalgias.  Skin: Negative for rash.  Neurological: Negative for dizziness and headaches.     Labs/Other Tests and Data Reviewed:    Recent Labs: 10/01/2019: ALT 28; BUN 10; Creatinine, Ser 0.76; Hemoglobin 15.4; Platelets 241; Potassium 3.9; Sodium 143   Recent Lipid Panel Lab Results  Component Value Date/Time   CHOL 160 10/01/2019 09:39 AM   TRIG 99 10/01/2019 09:39 AM   HDL 54 10/01/2019 09:39 AM   CHOLHDL 3.0 10/01/2019 09:39 AM   LDLCALC 88 10/01/2019 09:39 AM    Wt Readings from Last 3 Encounters:  12/16/19 154 lb 3.2 oz (69.9 kg)  10/01/19 154 lb 6.4 oz (70 kg)  09/20/19 153 lb 12.8 oz (69.8 kg)     Objective:    Vital Signs:  There were no vitals taken for this visit.  Physical Exam There was no physical exam due to telemed visit  ASSESSMENT & PLAN:     1. Rhinosinusitis -Rest and push fluids -Mucinex OTC as directed on package -Contact office if symptoms worsen or fail to improve  2. History of sinusitis     COVID-19 Education: The signs and symptoms of COVID-19 were discussed with the patient and how to seek care for testing (follow up with PCP or arrange E-visit). The importance of social distancing was discussed today.  Telephone telemedicine visit 10:54-11:01, 7 minutes  My nursing staff have aided in the documentation of this note on the behalf of Rip Harbour, NP,as directed by  Rip Harbour, NP and thoroughly reviewed by Rip Harbour, NP.  Follow Up:  Virtual Visit  prn  Signed,  Chance  [phone]

## 2020-02-21 ENCOUNTER — Telehealth: Payer: Self-pay

## 2020-02-21 ENCOUNTER — Other Ambulatory Visit: Payer: Self-pay | Admitting: Nurse Practitioner

## 2020-02-21 DIAGNOSIS — R059 Cough, unspecified: Secondary | ICD-10-CM

## 2020-02-21 DIAGNOSIS — J3489 Other specified disorders of nose and nasal sinuses: Secondary | ICD-10-CM

## 2020-02-21 MED ORDER — FLUTICASONE PROPIONATE 50 MCG/ACT NA SUSP
2.0000 | Freq: Every day | NASAL | 6 refills | Status: DC
Start: 2020-02-21 — End: 2020-10-10

## 2020-02-21 MED ORDER — BENZONATATE 100 MG PO CAPS: 100.0000 mg | ORAL_CAPSULE | Freq: Two times a day (BID) | ORAL | 0 refills | Status: DC | PRN

## 2020-02-21 NOTE — Telephone Encounter (Signed)
Patient states she has been taking Muxinex since her last office visit as prescribed but she is still having drainage and is coughing. Patient is requesting medication to help with this.

## 2020-03-01 DIAGNOSIS — L820 Inflamed seborrheic keratosis: Secondary | ICD-10-CM | POA: Diagnosis not present

## 2020-03-01 DIAGNOSIS — Z1159 Encounter for screening for other viral diseases: Secondary | ICD-10-CM | POA: Diagnosis not present

## 2020-03-01 DIAGNOSIS — L209 Atopic dermatitis, unspecified: Secondary | ICD-10-CM | POA: Diagnosis not present

## 2020-03-02 DIAGNOSIS — Z969 Presence of functional implant, unspecified: Secondary | ICD-10-CM | POA: Diagnosis not present

## 2020-03-07 DIAGNOSIS — R69 Illness, unspecified: Secondary | ICD-10-CM | POA: Diagnosis not present

## 2020-03-08 DIAGNOSIS — Y828 Other medical devices associated with adverse incidents: Secondary | ICD-10-CM | POA: Diagnosis not present

## 2020-03-08 DIAGNOSIS — T8484XA Pain due to internal orthopedic prosthetic devices, implants and grafts, initial encounter: Secondary | ICD-10-CM | POA: Diagnosis not present

## 2020-03-08 DIAGNOSIS — Z472 Encounter for removal of internal fixation device: Secondary | ICD-10-CM | POA: Diagnosis not present

## 2020-03-11 ENCOUNTER — Encounter: Payer: Self-pay | Admitting: Legal Medicine

## 2020-03-13 ENCOUNTER — Encounter: Payer: Self-pay | Admitting: Legal Medicine

## 2020-03-13 ENCOUNTER — Other Ambulatory Visit: Payer: Self-pay

## 2020-03-13 DIAGNOSIS — Z1211 Encounter for screening for malignant neoplasm of colon: Secondary | ICD-10-CM

## 2020-03-13 NOTE — Progress Notes (Signed)
I called patient today to remind her that it is time for her 10 year screening Colonoscopy.  Cologuard ordered per patient request.

## 2020-03-20 LAB — HM MAMMOGRAPHY

## 2020-03-21 DIAGNOSIS — Z1211 Encounter for screening for malignant neoplasm of colon: Secondary | ICD-10-CM | POA: Diagnosis not present

## 2020-03-21 LAB — COLOGUARD: Cologuard: NEGATIVE

## 2020-03-22 DIAGNOSIS — L299 Pruritus, unspecified: Secondary | ICD-10-CM | POA: Diagnosis not present

## 2020-03-22 DIAGNOSIS — L309 Dermatitis, unspecified: Secondary | ICD-10-CM | POA: Diagnosis not present

## 2020-03-29 DIAGNOSIS — R928 Other abnormal and inconclusive findings on diagnostic imaging of breast: Secondary | ICD-10-CM | POA: Diagnosis not present

## 2020-03-29 DIAGNOSIS — N959 Unspecified menopausal and perimenopausal disorder: Secondary | ICD-10-CM | POA: Diagnosis not present

## 2020-03-29 DIAGNOSIS — M85851 Other specified disorders of bone density and structure, right thigh: Secondary | ICD-10-CM | POA: Diagnosis not present

## 2020-03-29 DIAGNOSIS — Z1231 Encounter for screening mammogram for malignant neoplasm of breast: Secondary | ICD-10-CM | POA: Diagnosis not present

## 2020-03-29 DIAGNOSIS — M8589 Other specified disorders of bone density and structure, multiple sites: Secondary | ICD-10-CM | POA: Diagnosis not present

## 2020-03-29 LAB — HM DEXA SCAN

## 2020-04-02 LAB — COLOGUARD: COLOGUARD: NEGATIVE

## 2020-04-02 LAB — EXTERNAL GENERIC LAB PROCEDURE: COLOGUARD: NEGATIVE

## 2020-04-03 ENCOUNTER — Encounter: Payer: Self-pay | Admitting: Legal Medicine

## 2020-04-03 ENCOUNTER — Other Ambulatory Visit: Payer: Self-pay

## 2020-04-03 DIAGNOSIS — R928 Other abnormal and inconclusive findings on diagnostic imaging of breast: Secondary | ICD-10-CM

## 2020-04-04 ENCOUNTER — Ambulatory Visit (INDEPENDENT_AMBULATORY_CARE_PROVIDER_SITE_OTHER): Payer: Medicare HMO | Admitting: Legal Medicine

## 2020-04-04 ENCOUNTER — Encounter: Payer: Self-pay | Admitting: Legal Medicine

## 2020-04-04 ENCOUNTER — Other Ambulatory Visit: Payer: Self-pay

## 2020-04-04 VITALS — BP 130/80 | HR 71 | Temp 97.7°F | Resp 16 | Ht 62.5 in | Wt 139.2 lb

## 2020-04-04 DIAGNOSIS — K2100 Gastro-esophageal reflux disease with esophagitis, without bleeding: Secondary | ICD-10-CM

## 2020-04-04 DIAGNOSIS — F411 Generalized anxiety disorder: Secondary | ICD-10-CM

## 2020-04-04 DIAGNOSIS — M810 Age-related osteoporosis without current pathological fracture: Secondary | ICD-10-CM | POA: Diagnosis not present

## 2020-04-04 DIAGNOSIS — R69 Illness, unspecified: Secondary | ICD-10-CM | POA: Diagnosis not present

## 2020-04-04 DIAGNOSIS — M8588 Other specified disorders of bone density and structure, other site: Secondary | ICD-10-CM

## 2020-04-04 DIAGNOSIS — E782 Mixed hyperlipidemia: Secondary | ICD-10-CM | POA: Diagnosis not present

## 2020-04-04 NOTE — Progress Notes (Signed)
Subjective:  Patient ID: Kristen Mccarthy, female    DOB: May 24, 1945  Age: 75 y.o. MRN: [id number]  Chief Complaint  Patient presents with  . Hyperlipidemia  . Gastroesophageal Reflux    HPI: chronic visit GERD Patient has gastroesophageal reflux symptoms withesophagitis and LTRD.  The symptoms are mild intensity.  Length of symptoms 10 years.  Medicines include otc.  Complications include none.  Osteoporosis  Patient has osteoporosis with improving BMD on fosamax  Patient presents with hyperlipidemia.  Compliance with treatment has been good; patient takes medicines as directed, maintains low cholesterol diet, follows up as directed, and maintains exercise regimen.  Patient is using rosuvasttin without problems.     Current Outpatient Medications on File Prior to Visit  Medication Sig Dispense Refill  . alendronate (FOSAMAX) 70 MG tablet TAKE 1 TABLET ONCE WEEKLY 12 tablet 3  . ascorbic acid (VITAMIN C) 1000 MG tablet Take by mouth.    . benzonatate (TESSALON) 100 MG capsule Take 1 capsule (100 mg total) by mouth 2 (two) times daily as needed for cough. 20 capsule 0  . cholecalciferol (VITAMIN D) 1000 units tablet Take 2,000 Units by mouth daily.    . diazepam (VALIUM) 5 MG tablet Take 5 mg by mouth at bedtime as needed.    . fluticasone (FLONASE) 50 MCG/ACT nasal spray Place 2 sprays into both nostrils daily. 16 g 6  . magnesium oxide (MAG-OX) 400 (241.3 Mg) MG tablet Take 400 mg by mouth daily.    . nitrofurantoin (MACRODANTIN) 50 MG capsule Take 50 mg by mouth daily.    Marland Kitchen omeprazole (PRILOSEC) 40 MG capsule Take 1 capsule (40 mg total) by mouth daily. 90 capsule 2  . polyethylene glycol (MIRALAX / GLYCOLAX) packet Take 17 g by mouth daily.    . rosuvastatin (CRESTOR) 10 MG tablet TAKE 1 TABLET DAILY 90 tablet 2   No current facility-administered medications on file prior to visit.   Past Medical History:  Diagnosis Date  . Age-related osteoporosis without current  pathological fracture 07/23/2019  . Breast mass, left   . Calculus of gallbladder with chronic cholecystitis without obstruction 09/04/2015  . Complication of anesthesia   . Diffuse cystic mastopathy of right breast 07/23/2019  . GERD (gastroesophageal reflux disease)   . Hyperlipidemia   . IBS (irritable bowel syndrome)   . Mixed hyperlipidemia 07/23/2019  . Osteoporosis   . Other acute recurrent sinusitis 07/23/2019  . PONV (postoperative nausea and vomiting)    Past Surgical History:  Procedure Laterality Date  . ABDOMINAL HYSTERECTOMY    . BREAST LUMPECTOMY WITH RADIOACTIVE SEED LOCALIZATION Left 10/01/2017   Procedure: BREAST LUMPECTOMY WITH RADIOACTIVE SEED LOCALIZATION;  Surgeon: Abigail Miyamoto, MD;  Location: [facility];  Service: General;  Laterality: Left;  . CHOLECYSTECTOMY    . FOOT SURGERY Left   . INCONTINENCE SURGERY      History reviewed. No pertinent family history. Social History   Socioeconomic History  . Marital status: Married    Spouse name: Not on file  . Number of children: Not on file  . Years of education: Not on file  . Highest education level: Not on file  Occupational History  . Not on file  Tobacco Use  . Smoking status: Never Smoker  . Smokeless tobacco: Never Used  Substance and Sexual Activity  . Alcohol use: Never  . Drug use: Never  . Sexual activity: Not Currently    Birth control/protection: Surgical  Other Topics Concern  .  Not on file  Social History Narrative  . Not on file   Social Determinants of Health   Financial Resource Strain: Not on file  Food Insecurity: Not on file  Transportation Needs: Not on file  Physical Activity: Not on file  Stress: Not on file  Social Connections: Not on file    Review of Systems  Constitutional: Negative for activity change, appetite change and fever.  HENT: Negative for congestion, ear pain and sinus pain.   Eyes: Negative for visual disturbance.  Respiratory: Negative  for cough, chest tightness and wheezing.   Cardiovascular: Negative for chest pain, palpitations and leg swelling.  Gastrointestinal: Positive for abdominal distention. Negative for abdominal pain.  Endocrine: Negative for polyuria.  Genitourinary: Negative for difficulty urinating, dyspareunia and urgency.  Musculoskeletal: Positive for arthralgias. Negative for back pain.  Skin: Negative.   Neurological: Negative for dizziness, weakness and numbness.  Psychiatric/Behavioral: Negative for agitation.     Objective:  BP 130/80 (BP Location: Right Arm, Patient Position: Sitting)   Pulse 71   Temp 97.7 F (36.5 C) (Temporal)   Resp 16   Ht 5' 2.5" (1.588 m)   Wt 139 lb 3.2 oz (63.1 kg)   SpO2 98%   BMI 25.05 kg/m   BP/Weight 04/04/2020 02/18/2020 [id number]  Systolic BP [id number]  Diastolic BP 80 80 70  Wt. (Lbs) 139.2 144 154.2  BMI 25.05 25.92 27.75    Physical Exam Vitals reviewed.  Constitutional:      General: She is not in acute distress.    Appearance: Normal appearance. She is not toxic-appearing.  HENT:     Head: Normocephalic.     Right Ear: Tympanic membrane, ear canal and external ear normal.     Left Ear: Tympanic membrane, ear canal and external ear normal.     Mouth/Throat:     Mouth: Mucous membranes are moist.  Eyes:     Extraocular Movements: Extraocular movements intact.     Conjunctiva/sclera: Conjunctivae normal.     Pupils: Pupils are equal, round, and reactive to light.  Cardiovascular:     Rate and Rhythm: Normal rate and regular rhythm.     Pulses: Normal pulses.     Heart sounds: Normal heart sounds.  Pulmonary:     Effort: Pulmonary effort is normal. No respiratory distress.     Breath sounds: Normal breath sounds. No rales.  Abdominal:     General: Abdomen is flat. Bowel sounds are normal. There is no distension.     Tenderness: There is no abdominal tenderness.  Musculoskeletal:        General: No swelling. Normal range of motion.      Cervical back: Normal range of motion and neck supple.  Skin:    General: Skin is warm and dry.     Capillary Refill: Capillary refill takes less than 2 seconds.  Neurological:     General: No focal deficit present.     Mental Status: She is alert. Mental status is at baseline.  Psychiatric:        Mood and Affect: Mood normal.        Thought Content: Thought content normal.        Judgment: Judgment normal.       Lab Results  Component Value Date   WBC 7.3 10/01/2019   HGB 15.4 10/01/2019   HCT 44.2 10/01/2019   PLT 241 10/01/2019   GLUCOSE 104 (H) 10/01/2019   CHOL 160 10/01/2019   TRIG  99 10/01/2019   HDL 54 10/01/2019   LDLCALC 88 10/01/2019   ALT 28 10/01/2019   AST 23 10/01/2019   NA 143 10/01/2019   K 3.9 10/01/2019   CL 104 10/01/2019   CREATININE 0.76 10/01/2019   BUN 10 10/01/2019   CO2 21 10/01/2019      Assessment & Plan:   1. Osteopenia of lumbar spine Patient has osteopenia and is on calcium and vitamin d  2. Mixed hyperlipidemia AN INDIVIDUAL CARE PLAN for hyperlipidemia/ cholesterol was established and reinforced today.  The patient's status was assessed using clinical findings on exam, lab and other diagnostic tests. The patient's disease status was assessed based on evidence-based guidelines and found to be well controlled. MEDICATIONS were reviewed. SELF MANAGEMENT GOALS have been discussed and patient's success at attaining the goal of low cholesterol was assessed. RECOMMENDATION given include regular exercise 3 days a week and low cholesterol/low fat diet. CLINICAL SUMMARY including written plan to identify barriers unique to the patient due to social or economic  reasons was discussed.  3. Gastroesophageal reflux disease with esophagitis without hemorrhage Plan of care was formulated today.  She is doing well.  A plan of care was formulated using patient exam, tests and other sources to optimize care using evidence based information.   Recommend no smoking, no eating after supper, avoid fatty foods, elevate Head of bed, avoid tight fitting clothing.  Continue on omeprazole.          I spent 20 minutes dedicated to the care of this patient on the date of this encounter to include face-to-face time with the patient, as well as: reviewd immunizations and care  Follow-up: Return in about 6 months (around 10/02/2020) for fasting.  An After Visit Summary was printed and given to the patient.  Reinaldo Meeker, MD [facility] [phone]

## 2020-04-05 LAB — COMPREHENSIVE METABOLIC PANEL
ALT: 22 IU/L (ref 0–32)
AST: 17 IU/L (ref 0–40)
Albumin/Globulin Ratio: 2 (ref 1.2–2.2)
Albumin: 4.7 g/dL (ref 3.7–4.7)
Alkaline Phosphatase: 60 IU/L (ref 44–121)
BUN/Creatinine Ratio: 19 (ref 12–28)
BUN: 15 mg/dL (ref 8–27)
Bilirubin Total: 0.5 mg/dL (ref 0.0–1.2)
CO2: 22 mmol/L (ref 20–29)
Calcium: 10.3 mg/dL (ref 8.7–10.3)
Chloride: 104 mmol/L (ref 96–106)
Creatinine, Ser: 0.77 mg/dL (ref 0.57–1.00)
GFR calc Af Amer: 87 mL/min/{1.73_m2} (ref >59)
GFR calc non Af Amer: 76 mL/min/{1.73_m2} (ref >59)
Globulin, Total: 2.3 g/dL (ref 1.5–4.5)
Glucose: 99 mg/dL (ref 65–99)
Potassium: 4.1 mmol/L (ref 3.5–5.2)
Sodium: 140 mmol/L (ref 134–144)
Total Protein: 7 g/dL (ref 6.0–8.5)

## 2020-04-05 LAB — CBC WITH DIFFERENTIAL/PLATELET
Basophils Absolute: 0 10*3/uL (ref 0.0–0.2)
Basos: 1 %
EOS (ABSOLUTE): 0.1 10*3/uL (ref 0.0–0.4)
Eos: 1 %
Hematocrit: 47 % — ABNORMAL HIGH (ref 34.0–46.6)
Hemoglobin: 15.9 g/dL (ref 11.1–15.9)
Immature Grans (Abs): 0 10*3/uL (ref 0.0–0.1)
Immature Granulocytes: 0 %
Lymphocytes Absolute: 2.6 10*3/uL (ref 0.7–3.1)
Lymphs: 32 %
MCH: 31 pg (ref 26.6–33.0)
MCHC: 33.8 g/dL (ref 31.5–35.7)
MCV: 92 fL (ref 79–97)
Monocytes Absolute: 0.8 10*3/uL (ref 0.1–0.9)
Monocytes: 9 %
Neutrophils Absolute: 4.7 10*3/uL (ref 1.4–7.0)
Neutrophils: 57 %
Platelets: 269 10*3/uL (ref 150–450)
RBC: 5.13 x10E6/uL (ref 3.77–5.28)
RDW: 12.3 % (ref 11.7–15.4)
WBC: 8.2 10*3/uL (ref 3.4–10.8)

## 2020-04-05 LAB — LIPID PANEL
Chol/HDL Ratio: 2.5 ratio (ref 0.0–4.4)
Cholesterol, Total: 151 mg/dL (ref 100–199)
HDL: 60 mg/dL (ref >39)
LDL Chol Calc (NIH): 76 mg/dL (ref 0–99)
Triglycerides: 78 mg/dL (ref 0–149)
VLDL Cholesterol Cal: 15 mg/dL (ref 5–40)

## 2020-04-05 LAB — CARDIOVASCULAR RISK ASSESSMENT

## 2020-04-05 NOTE — Progress Notes (Signed)
CBC ok, kidney and liver tests normal, Cholesterol normal lp

## 2020-04-10 ENCOUNTER — Encounter: Payer: Self-pay | Admitting: Legal Medicine

## 2020-04-13 DIAGNOSIS — H5202 Hypermetropia, left eye: Secondary | ICD-10-CM | POA: Diagnosis not present

## 2020-04-27 DIAGNOSIS — N6489 Other specified disorders of breast: Secondary | ICD-10-CM | POA: Diagnosis not present

## 2020-04-27 DIAGNOSIS — R928 Other abnormal and inconclusive findings on diagnostic imaging of breast: Secondary | ICD-10-CM | POA: Diagnosis not present

## 2020-04-28 ENCOUNTER — Other Ambulatory Visit: Payer: Self-pay

## 2020-04-28 DIAGNOSIS — Z1231 Encounter for screening mammogram for malignant neoplasm of breast: Secondary | ICD-10-CM

## 2020-05-10 ENCOUNTER — Other Ambulatory Visit: Payer: Self-pay

## 2020-05-10 MED ORDER — ALENDRONATE SODIUM 70 MG PO TABS: ORAL_TABLET | ORAL | 3 refills | Status: DC

## 2020-06-23 ENCOUNTER — Other Ambulatory Visit: Payer: Self-pay | Admitting: Legal Medicine

## 2020-07-28 ENCOUNTER — Telehealth (INDEPENDENT_AMBULATORY_CARE_PROVIDER_SITE_OTHER): Payer: Medicare HMO | Admitting: Legal Medicine

## 2020-07-28 ENCOUNTER — Encounter: Payer: Self-pay | Admitting: Legal Medicine

## 2020-07-28 VITALS — Temp 97.4°F | Ht 62.5 in | Wt 139.0 lb

## 2020-07-28 DIAGNOSIS — J0100 Acute maxillary sinusitis, unspecified: Secondary | ICD-10-CM | POA: Diagnosis not present

## 2020-07-28 MED ORDER — TRIAMCINOLONE ACETONIDE 40 MG/ML IJ SUSP
80.0000 mg | Freq: Once | INTRAMUSCULAR | Status: AC
  Administered 2020-07-28: 80 mg via INTRAMUSCULAR

## 2020-07-28 MED ORDER — DOXYCYCLINE HYCLATE 100 MG PO TABS: 100.0000 mg | ORAL_TABLET | Freq: Two times a day (BID) | ORAL | 0 refills | Status: DC

## 2020-07-28 NOTE — Progress Notes (Signed)
Virtual Visit via Telephone Note   This visit type was conducted due to national recommendations for restrictions regarding the COVID-19 Pandemic (e.g. social distancing) in an effort to limit this patient's exposure and mitigate transmission in our community.  Due to her co-morbid illnesses, this patient is at least at moderate risk for complications without adequate follow up.  This format is felt to be most appropriate for this patient at this time.  The patient did not have access to video technology/had technical difficulties with video requiring transitioning to audio format only (telephone).  All issues noted in this document were discussed and addressed.  No physical exam could be performed with this format.  Patient verbally consented to a telehealth visit.   Date:  07/28/2020   ID:  Mccarthy, Kristen 12/28/1945, MRN [id number]  Patient Location: Home Provider Location: Office/Clinic  PCP:  Lillard Anes, MD   Evaluation Performed:  New Patient Evaluation  Chief Complaint:  Sinus congestion working on lawn  History of Present Illness:    Kristen Mccarthy is a 75 y.o. female with Sinus congestion working on lawn  The patient does not have symptoms concerning for COVID-19 infection (fever, chills, cough, or new shortness of breath).    Past Medical History:  Diagnosis Date  . Age-related osteoporosis without current pathological fracture 07/23/2019  . Breast mass, left   . Calculus of gallbladder with chronic cholecystitis without obstruction 09/04/2015  . Complication of anesthesia   . Diffuse cystic mastopathy of right breast 07/23/2019  . GERD (gastroesophageal reflux disease)   . Hyperlipidemia   . IBS (irritable bowel syndrome)   . Mixed hyperlipidemia 07/23/2019  . Osteoporosis   . Other acute recurrent sinusitis 07/23/2019  . PONV (postoperative nausea and vomiting)     Past Surgical History:  Procedure Laterality Date  . ABDOMINAL HYSTERECTOMY     . BREAST LUMPECTOMY WITH RADIOACTIVE SEED LOCALIZATION Left 10/01/2017   Procedure: BREAST LUMPECTOMY WITH RADIOACTIVE SEED LOCALIZATION;  Surgeon: Coralie Keens, MD;  Location: [place];  Service: General;  Laterality: Left;  . CHOLECYSTECTOMY    . FOOT SURGERY Left   . INCONTINENCE SURGERY      History reviewed. No pertinent family history.  Social History   Socioeconomic History  . Marital status: Married    Spouse name: Not on file  . Number of children: Not on file  . Years of education: Not on file  . Highest education level: Not on file  Occupational History  . Not on file  Tobacco Use  . Smoking status: Never Smoker  . Smokeless tobacco: Never Used  Substance and Sexual Activity  . Alcohol use: Never  . Drug use: Never  . Sexual activity: Not Currently    Birth control/protection: Surgical  Other Topics Concern  . Not on file  Social History Narrative  . Not on file   Social Determinants of Health   Financial Resource Strain: Not on file  Food Insecurity: Not on file  Transportation Needs: Not on file  Physical Activity: Not on file  Stress: Not on file  Social Connections: Not on file  Intimate Partner Violence: Not At Risk  . Fear of Current or Ex-Partner: No  . Emotionally Abused: No  . Physically Abused: No  . Sexually Abused: No    Outpatient Medications Prior to Visit  Medication Sig Dispense Refill  . alendronate (FOSAMAX) 70 MG tablet TAKE 1 TABLET ONCE WEEKLY 12 tablet 3  . ascorbic  acid (VITAMIN C) 1000 MG tablet Take by mouth.    . benzonatate (TESSALON) 100 MG capsule Take 1 capsule (100 mg total) by mouth 2 (two) times daily as needed for cough. 20 capsule 0  . cholecalciferol (VITAMIN D) 1000 units tablet Take 2,000 Units by mouth daily.    . diazepam (VALIUM) 5 MG tablet Take 5 mg by mouth at bedtime as needed.    . fluticasone (FLONASE) 50 MCG/ACT nasal spray Place 2 sprays into both nostrils daily. 16 g 6  .  magnesium oxide (MAG-OX) 400 (241.3 Mg) MG tablet Take 400 mg by mouth daily.    . nitrofurantoin (MACRODANTIN) 50 MG capsule Take 50 mg by mouth daily.    Marland Kitchen omeprazole (PRILOSEC) 40 MG capsule TAKE 1 CAPSULE DAILY 90 capsule 2  . polyethylene glycol (MIRALAX / GLYCOLAX) packet Take 17 g by mouth daily.    . rosuvastatin (CRESTOR) 10 MG tablet TAKE 1 TABLET DAILY 90 tablet 2   No facility-administered medications prior to visit.    Allergies:   Sulfa antibiotics, Azithromycin, Clindamycin/lincomycin, and Penicillins   Social History   Tobacco Use  . Smoking status: Never Smoker  . Smokeless tobacco: Never Used  Substance Use Topics  . Alcohol use: Never  . Drug use: Never     Review of Systems  Constitutional: Negative for chills and fever.  HENT: Positive for congestion and sinus pain. Negative for sore throat.   Respiratory: Positive for cough.   Cardiovascular: Negative for chest pain, palpitations and orthopnea.  Gastrointestinal: Negative for melena.  Genitourinary: Negative for dysuria.  Musculoskeletal: Negative for myalgias.  Neurological: Negative for dizziness.     Labs/Other Tests and Data Reviewed:    Recent Labs: 04/04/2020: ALT 22; BUN 15; Creatinine, Ser 0.77; Hemoglobin 15.9; Platelets 269; Potassium 4.1; Sodium 140   Recent Lipid Panel Lab Results  Component Value Date/Time   CHOL 151 04/04/2020 09:58 AM   TRIG 78 04/04/2020 09:58 AM   HDL 60 04/04/2020 09:58 AM   CHOLHDL 2.5 04/04/2020 09:58 AM   LDLCALC 76 04/04/2020 09:58 AM    Wt Readings from Last 3 Encounters:  07/28/20 139 lb (63 kg)  04/04/20 139 lb 3.2 oz (63.1 kg)  02/18/20 144 lb (65.3 kg)     Objective:    Vital Signs:  Temp (!) 97.4 F (36.3 C)   Ht 5' 2.5" (1.588 m)   Wt 139 lb (63 kg)   BMI 25.02 kg/m    Physical Exam reviewed  ASSESSMENT & PLAN:    Diagnoses and all orders for this visit: Acute non-recurrent maxillary sinusitis -     doxycycline (VIBRA-TABS) 100 MG  tablet; Take 1 tablet (100 mg total) by mouth 2 (two) times daily. Treat sinusitis with doxycycline use saline nasal spray  20 minute visit    COVID-19 Education: The signs and symptoms of COVID-19 were discussed with the patient and how to seek care for testing (follow up with PCP or arrange E-visit). The importance of social distancing was discussed today.   I spent 20 minutes dedicated to the care of this patient on the date of this encounter to include face-to-face time with the patient, as well as:   Follow Up:  In Person prn  Signed,  Reinaldo Meeker, MD  07/28/2020 9:46 AM    [place]

## 2020-08-29 DIAGNOSIS — R3915 Urgency of urination: Secondary | ICD-10-CM | POA: Diagnosis not present

## 2020-08-29 DIAGNOSIS — N3090 Cystitis, unspecified without hematuria: Secondary | ICD-10-CM | POA: Diagnosis not present

## 2020-09-07 ENCOUNTER — Telehealth: Payer: Self-pay

## 2020-09-07 NOTE — Telephone Encounter (Signed)
Pt calling to have mamm set up for July. Orders are in. Pt would like RH.   Royce Macadamia, [place] 09/07/20 11:07 AM

## 2020-09-11 ENCOUNTER — Other Ambulatory Visit: Payer: Self-pay

## 2020-09-11 DIAGNOSIS — R928 Other abnormal and inconclusive findings on diagnostic imaging of breast: Secondary | ICD-10-CM

## 2020-09-11 NOTE — Telephone Encounter (Signed)
Do you want me to cancel the screening and put the order for diagnostic mammogram bilateral? I do not how to put the ultrasound in Epic.

## 2020-09-12 ENCOUNTER — Telehealth: Payer: Self-pay | Admitting: Legal Medicine

## 2020-09-12 NOTE — Telephone Encounter (Signed)
   Kristen Mccarthy has been scheduled for the following appointment:  WHAT: Left Diagnostic Mammogram WHERE: [facility] DATE: 10/26/20 TIME: 8:50 am arrival time  Patient has been made aware.

## 2020-09-14 DIAGNOSIS — Z7722 Contact with and (suspected) exposure to environmental tobacco smoke (acute) (chronic): Secondary | ICD-10-CM | POA: Diagnosis not present

## 2020-09-14 DIAGNOSIS — M810 Age-related osteoporosis without current pathological fracture: Secondary | ICD-10-CM | POA: Diagnosis not present

## 2020-09-14 DIAGNOSIS — R030 Elevated blood-pressure reading, without diagnosis of hypertension: Secondary | ICD-10-CM | POA: Diagnosis not present

## 2020-09-14 DIAGNOSIS — N393 Stress incontinence (female) (male): Secondary | ICD-10-CM | POA: Diagnosis not present

## 2020-09-14 DIAGNOSIS — Z7983 Long term (current) use of bisphosphonates: Secondary | ICD-10-CM | POA: Diagnosis not present

## 2020-09-14 DIAGNOSIS — Z8249 Family history of ischemic heart disease and other diseases of the circulatory system: Secondary | ICD-10-CM | POA: Diagnosis not present

## 2020-09-14 DIAGNOSIS — K219 Gastro-esophageal reflux disease without esophagitis: Secondary | ICD-10-CM | POA: Diagnosis not present

## 2020-09-14 DIAGNOSIS — M1990 Unspecified osteoarthritis, unspecified site: Secondary | ICD-10-CM | POA: Diagnosis not present

## 2020-09-14 DIAGNOSIS — Z803 Family history of malignant neoplasm of breast: Secondary | ICD-10-CM | POA: Diagnosis not present

## 2020-09-14 DIAGNOSIS — K5900 Constipation, unspecified: Secondary | ICD-10-CM | POA: Diagnosis not present

## 2020-10-10 ENCOUNTER — Other Ambulatory Visit: Payer: Self-pay

## 2020-10-10 ENCOUNTER — Ambulatory Visit (INDEPENDENT_AMBULATORY_CARE_PROVIDER_SITE_OTHER): Payer: Medicare HMO | Admitting: Legal Medicine

## 2020-10-10 ENCOUNTER — Encounter: Payer: Self-pay | Admitting: Legal Medicine

## 2020-10-10 VITALS — BP 132/70 | HR 76 | Temp 96.5°F | Ht 63.0 in | Wt 142.4 lb

## 2020-10-10 DIAGNOSIS — F5102 Adjustment insomnia: Secondary | ICD-10-CM | POA: Diagnosis not present

## 2020-10-10 DIAGNOSIS — K2100 Gastro-esophageal reflux disease with esophagitis, without bleeding: Secondary | ICD-10-CM | POA: Diagnosis not present

## 2020-10-10 DIAGNOSIS — R69 Illness, unspecified: Secondary | ICD-10-CM | POA: Diagnosis not present

## 2020-10-10 DIAGNOSIS — E782 Mixed hyperlipidemia: Secondary | ICD-10-CM

## 2020-10-10 DIAGNOSIS — M8588 Other specified disorders of bone density and structure, other site: Secondary | ICD-10-CM

## 2020-10-10 MED ORDER — LORAZEPAM 0.5 MG PO TABS: 0.5000 mg | ORAL_TABLET | Freq: Two times a day (BID) | ORAL | 1 refills | Status: DC | PRN

## 2020-10-10 MED ORDER — FAMOTIDINE 40 MG PO TABS: 40.0000 mg | ORAL_TABLET | Freq: Every day | ORAL | 2 refills | Status: DC

## 2020-10-10 NOTE — Progress Notes (Signed)
Established Patient Office Visit  Subjective:  Patient ID: Kristen Mccarthy, female    DOB: June 03, 1945  Age: 75 y.o. MRN: [id number]  CC:  Chief Complaint  Patient presents with   Hyperlipidemia   Gastroesophageal Reflux    HPI Kristen Mccarthy presents for chronic visit  Patient presents with hyperlipidemia.  Compliance with treatment has been good; patient takes medicines as directed, maintains low cholesterol diet, follows up as directed, and maintains exercise regimen.  Patient is using none without problems.   Past Medical History:  Diagnosis Date   Age-related osteoporosis without current pathological fracture 07/23/2019   Breast mass, left    Calculus of gallbladder with chronic cholecystitis without obstruction 3/0/1601   Complication of anesthesia    Diffuse cystic mastopathy of right breast 07/23/2019   GERD (gastroesophageal reflux disease)    Hyperlipidemia    IBS (irritable bowel syndrome)    Mixed hyperlipidemia 07/23/2019   Osteoporosis    Other acute recurrent sinusitis 07/23/2019   PONV (postoperative nausea and vomiting)     Past Surgical History:  Procedure Laterality Date   ABDOMINAL HYSTERECTOMY     BREAST LUMPECTOMY WITH RADIOACTIVE SEED LOCALIZATION Left 10/01/2017   Procedure: BREAST LUMPECTOMY WITH RADIOACTIVE SEED LOCALIZATION;  Surgeon: Coralie Keens, MD;  Location: [place];  Service: General;  Laterality: Left;   CHOLECYSTECTOMY     FOOT SURGERY Left    INCONTINENCE SURGERY      History reviewed. No pertinent family history.  Social History   Socioeconomic History   Marital status: Married    Spouse name: Not on file   Number of children: Not on file   Years of education: Not on file   Highest education level: Not on file  Occupational History   Not on file  Tobacco Use   Smoking status: Never   Smokeless tobacco: Never  Substance and Sexual Activity   Alcohol use: Never   Drug use: Never   Sexual  activity: Not Currently    Birth control/protection: Surgical  Other Topics Concern   Not on file  Social History Narrative   Not on file   Social Determinants of Health   Financial Resource Strain: Not on file  Food Insecurity: Not on file  Transportation Needs: Not on file  Physical Activity: Not on file  Stress: Not on file  Social Connections: Not on file  Intimate Partner Violence: Not At Risk   Fear of Current or Ex-Partner: No   Emotionally Abused: No   Physically Abused: No   Sexually Abused: No    Outpatient Medications Prior to Visit  Medication Sig Dispense Refill   alendronate (FOSAMAX) 70 MG tablet TAKE 1 TABLET ONCE WEEKLY 12 tablet 3   ascorbic acid (VITAMIN C) 1000 MG tablet Take by mouth.     cholecalciferol (VITAMIN D) 1000 units tablet Take 2,000 Units by mouth daily.     magnesium oxide (MAG-OX) 400 (241.3 Mg) MG tablet Take 400 mg by mouth daily.     polyethylene glycol (MIRALAX / GLYCOLAX) packet Take 17 g by mouth daily.     diazepam (VALIUM) 5 MG tablet Take 5 mg by mouth at bedtime as needed.     omeprazole (PRILOSEC) 40 MG capsule TAKE 1 CAPSULE DAILY 90 capsule 2   benzonatate (TESSALON) 100 MG capsule Take 1 capsule (100 mg total) by mouth 2 (two) times daily as needed for cough. 20 capsule 0   doxycycline (VIBRA-TABS) 100 MG tablet Take 1  tablet (100 mg total) by mouth 2 (two) times daily. 20 tablet 0   fluticasone (FLONASE) 50 MCG/ACT nasal spray Place 2 sprays into both nostrils daily. 16 g 6   nitrofurantoin (MACRODANTIN) 50 MG capsule Take 50 mg by mouth daily.     rosuvastatin (CRESTOR) 10 MG tablet TAKE 1 TABLET DAILY 90 tablet 2   No facility-administered medications prior to visit.    Allergies  Allergen Reactions   Sulfa Antibiotics Other (See Comments)    Stomach ache Stomach ache   Azithromycin Rash   Clindamycin/Lincomycin Rash   Penicillins Rash    ROS Review of Systems  Constitutional:  Negative for activity change and  appetite change.  HENT:  Negative for congestion.   Eyes:  Negative for visual disturbance.  Respiratory:  Negative for apnea and choking.   Cardiovascular:  Negative for chest pain, palpitations and leg swelling.  Gastrointestinal:  Negative for abdominal distention and abdominal pain.  Endocrine: Negative for polyuria.  Genitourinary:  Negative for difficulty urinating and dysuria.  Musculoskeletal:  Negative for arthralgias and back pain.  Skin:  Negative for color change and pallor.  Neurological: Negative.   Psychiatric/Behavioral: Negative.       Objective:    Physical Exam Vitals reviewed.  Constitutional:      Appearance: Normal appearance. She is normal weight.  HENT:     Head: Normocephalic.     Right Ear: Tympanic membrane, ear canal and external ear normal.     Left Ear: Tympanic membrane, ear canal and external ear normal.     Mouth/Throat:     Mouth: Mucous membranes are moist.     Pharynx: Oropharynx is clear.  Eyes:     Extraocular Movements: Extraocular movements intact.     Conjunctiva/sclera: Conjunctivae normal.     Pupils: Pupils are equal, round, and reactive to light.  Cardiovascular:     Rate and Rhythm: Normal rate and regular rhythm.     Pulses: Normal pulses.     Heart sounds: No murmur heard.   No gallop.  Pulmonary:     Effort: Pulmonary effort is normal. No respiratory distress.     Breath sounds: Normal breath sounds. No wheezing.  Abdominal:     General: Abdomen is flat. Bowel sounds are normal. There is no distension.     Palpations: Abdomen is soft.     Tenderness: There is no abdominal tenderness.  Musculoskeletal:        General: Normal range of motion.     Cervical back: Normal range of motion and neck supple.  Skin:    General: Skin is warm and dry.     Capillary Refill: Capillary refill takes less than 2 seconds.  Neurological:     General: No focal deficit present.     Mental Status: She is alert and oriented to person, place,  and time. Mental status is at baseline.  Psychiatric:        Mood and Affect: Mood normal.        Behavior: Behavior normal.    BP 132/70   Pulse 76   Temp (!) 96.5 F (35.8 C)   Ht 5\' 3"  (1.6 m)   Wt 142 lb 6.4 oz (64.6 kg)   SpO2 98%   BMI 25.23 kg/m  Wt Readings from Last 3 Encounters:  10/10/20 142 lb 6.4 oz (64.6 kg)  07/28/20 139 lb (63 kg)  04/04/20 139 lb 3.2 oz (63.1 kg)     Health Maintenance Due  Topic Date Due   Hepatitis C Screening  Never done   Zoster Vaccines- Shingrix (1 of 2) Never done    There are no preventive care reminders to display for this patient.  No results found for: TSH Lab Results  Component Value Date   WBC 8.2 04/04/2020   HGB 15.9 04/04/2020   HCT 47.0 (H) 04/04/2020   MCV 92 04/04/2020   PLT 269 04/04/2020   Lab Results  Component Value Date   NA 140 04/04/2020   K 4.1 04/04/2020   CO2 22 04/04/2020   GLUCOSE 99 04/04/2020   BUN 15 04/04/2020   CREATININE 0.77 04/04/2020   BILITOT 0.5 04/04/2020   ALKPHOS 60 04/04/2020   AST 17 04/04/2020   ALT 22 04/04/2020   PROT 7.0 04/04/2020   ALBUMIN 4.7 04/04/2020   CALCIUM 10.3 04/04/2020   Lab Results  Component Value Date   CHOL 151 04/04/2020   Lab Results  Component Value Date   HDL 60 04/04/2020   Lab Results  Component Value Date   LDLCALC 76 04/04/2020   Lab Results  Component Value Date   TRIG 78 04/04/2020   Lab Results  Component Value Date   CHOLHDL 2.5 04/04/2020   No results found for: HGBA1C    Assessment & Plan:   Problem List Items Addressed This Visit       Digestive   GERD (gastroesophageal reflux disease) Plan of care was formulated today.  She is doing well.  A plan of care was formulated using patient exam, tests and other sources to optimize care using evidence based information.  Recommend no smoking, no eating after supper, avoid fatty foods, elevate Head of bed, avoid tight fitting clothing.  Continue on pepcid.      Other    Mixed hyperlipidemia - Primary   Relevant Orders   Lipid panel AN INDIVIDUAL CARE PLAN for hyperlipidemia/ cholesterol was established and reinforced today.  The patient's status was assessed using clinical findings on exam, lab and other diagnostic tests. The patient's disease status was assessed based on evidence-based guidelines and found to be well controlled. MEDICATIONS were reviewed. SELF MANAGEMENT GOALS have been discussed and patient's success at attaining the goal of low cholesterol was assessed. RECOMMENDATION given include regular exercise 3 days a week and low cholesterol/low fat diet. CLINICAL SUMMARY including written plan to identify barriers unique to the patient due to social or economic  reasons was discussed.    Other Visit Diagnoses     Osteopenia of lumbar spine    Patient has osteopenia and is on alendronate          Follow-up: Return in about 6 months (around 04/12/2021) for fasting.    Reinaldo Meeker, MD

## 2020-10-11 ENCOUNTER — Other Ambulatory Visit: Payer: Self-pay | Admitting: Legal Medicine

## 2020-10-11 DIAGNOSIS — E782 Mixed hyperlipidemia: Secondary | ICD-10-CM

## 2020-10-11 LAB — LIPID PANEL
Chol/HDL Ratio: 4.3 ratio (ref 0.0–4.4)
Cholesterol, Total: 236 mg/dL — ABNORMAL HIGH (ref 100–199)
HDL: 55 mg/dL (ref >39)
LDL Chol Calc (NIH): 150 mg/dL — ABNORMAL HIGH (ref 0–99)
Triglycerides: 172 mg/dL — ABNORMAL HIGH (ref 0–149)
VLDL Cholesterol Cal: 31 mg/dL (ref 5–40)

## 2020-10-11 LAB — CARDIOVASCULAR RISK ASSESSMENT

## 2020-10-11 MED ORDER — PRAVASTATIN SODIUM 40 MG PO TABS: 40.0000 mg | ORAL_TABLET | Freq: Every day | ORAL | 3 refills | Status: DC

## 2020-10-11 NOTE — Progress Notes (Signed)
Triglycerides high 172, LDL cholesterol 150 high, consider pravastatin,  lp

## 2020-10-12 ENCOUNTER — Other Ambulatory Visit: Payer: Self-pay

## 2020-10-12 DIAGNOSIS — E782 Mixed hyperlipidemia: Secondary | ICD-10-CM

## 2020-10-12 MED ORDER — PRAVASTATIN SODIUM 40 MG PO TABS: 40.0000 mg | ORAL_TABLET | Freq: Every day | ORAL | 3 refills | Status: DC

## 2020-10-12 NOTE — Telephone Encounter (Signed)
Pt needed change in pharmacy.   Kristen Mccarthy, [place] 10/12/20 9:18 AM

## 2020-10-26 ENCOUNTER — Other Ambulatory Visit: Payer: Self-pay

## 2020-10-26 DIAGNOSIS — R922 Inconclusive mammogram: Secondary | ICD-10-CM | POA: Diagnosis not present

## 2020-10-26 DIAGNOSIS — R928 Other abnormal and inconclusive findings on diagnostic imaging of breast: Secondary | ICD-10-CM | POA: Diagnosis not present

## 2020-10-26 DIAGNOSIS — N6489 Other specified disorders of breast: Secondary | ICD-10-CM | POA: Diagnosis not present

## 2020-12-01 DIAGNOSIS — N390 Urinary tract infection, site not specified: Secondary | ICD-10-CM | POA: Diagnosis not present

## 2020-12-26 ENCOUNTER — Ambulatory Visit (INDEPENDENT_AMBULATORY_CARE_PROVIDER_SITE_OTHER): Payer: Medicare HMO

## 2020-12-26 ENCOUNTER — Other Ambulatory Visit: Payer: Self-pay

## 2020-12-26 VITALS — BP 120/72 | HR 70 | Resp 16 | Ht 63.0 in | Wt 145.8 lb

## 2020-12-26 DIAGNOSIS — Z0000 Encounter for general adult medical examination without abnormal findings: Secondary | ICD-10-CM | POA: Diagnosis not present

## 2020-12-26 NOTE — Patient Instructions (Signed)
Health Maintenance, Female Adopting a healthy lifestyle and getting preventive care are important in promoting health and wellness. Ask your health care provider about: The right schedule for you to have regular tests and exams. Things you can do on your own to prevent diseases and keep yourself healthy. What should I know about diet, weight, and exercise? Eat a healthy diet  Eat a diet that includes plenty of vegetables, fruits, low-fat dairy products, and lean protein. Do not eat a lot of foods that are high in solid fats, added sugars, or sodium. Maintain a healthy weight Body mass index (BMI) is used to identify weight problems. It estimates body fat based on height and weight. Your health care provider can help determine your BMI and help you achieve or maintain a healthy weight. Get regular exercise Get regular exercise. This is one of the most important things you can do for your health. Most adults should: Exercise for at least 150 minutes each week. The exercise should increase your heart rate and make you sweat (moderate-intensity exercise). Do strengthening exercises at least twice a week. This is in addition to the moderate-intensity exercise. Spend less time sitting. Even light physical activity can be beneficial. Watch cholesterol and blood lipids Have your blood tested for lipids and cholesterol at 76 years of age, then have this test every 5 years. Have your cholesterol levels checked more often if: Your lipid or cholesterol levels are high. You are older than 76 years of age. You are at high risk for heart disease. What should I know about cancer screening? Depending on your health history and family history, you may need to have cancer screening at various ages. This may include screening for: Breast cancer. Cervical cancer. Colorectal cancer. Skin cancer. Lung cancer. What should I know about heart disease, diabetes, and high blood pressure? Blood pressure and heart  disease High blood pressure causes heart disease and increases the risk of stroke. This is more likely to develop in people who have high blood pressure readings, are of African descent, or are overweight. Have your blood pressure checked: Every 3-5 years if you are 18-39 years of age. Every year if you are 40 years old or older. Diabetes Have regular diabetes screenings. This checks your fasting blood sugar level. Have the screening done: Once every three years after age 40 if you are at a normal weight and have a low risk for diabetes. More often and at a younger age if you are overweight or have a high risk for diabetes. What should I know about preventing infection? Hepatitis B If you have a higher risk for hepatitis B, you should be screened for this virus. Talk with your health care provider to find out if you are at risk for hepatitis B infection. Hepatitis C Testing is recommended for: Everyone born from 1945 through 1965. Anyone with known risk factors for hepatitis C. Sexually transmitted infections (STIs) Get screened for STIs, including gonorrhea and chlamydia, if: You are sexually active and are younger than 76 years of age. You are older than 76 years of age and your health care provider tells you that you are at risk for this type of infection. Your sexual activity has changed since you were last screened, and you are at increased risk for chlamydia or gonorrhea. Ask your health care provider if you are at risk. Ask your health care provider about whether you are at high risk for HIV. Your health care provider may recommend a prescription medicine   to help prevent HIV infection. If you choose to take medicine to prevent HIV, you should first get tested for HIV. You should then be tested every 3 months for as long as you are taking the medicine. Pregnancy If you are about to stop having your period (premenopausal) and you may become pregnant, seek counseling before you get  pregnant. Take 400 to 800 micrograms (mcg) of folic acid every day if you become pregnant. Ask for birth control (contraception) if you want to prevent pregnancy. Osteoporosis and menopause Osteoporosis is a disease in which the bones lose minerals and strength with aging. This can result in bone fractures. If you are 65 years old or older, or if you are at risk for osteoporosis and fractures, ask your health care provider if you should: Be screened for bone loss. Take a calcium or vitamin D supplement to lower your risk of fractures. Be given hormone replacement therapy (HRT) to treat symptoms of menopause. Follow these instructions at home: Lifestyle Do not use any products that contain nicotine or tobacco, such as cigarettes, e-cigarettes, and chewing tobacco. If you need help quitting, ask your health care provider. Do not use street drugs. Do not share needles. Ask your health care provider for help if you need support or information about quitting drugs. Alcohol use Do not drink alcohol if: Your health care provider tells you not to drink. You are pregnant, may be pregnant, or are planning to become pregnant. If you drink alcohol: Limit how much you use to 0-1 drink a day. Limit intake if you are breastfeeding. Be aware of how much alcohol is in your drink. In the [place] one drink equals one 12 oz bottle of beer (355 mL), one 5 oz glass of wine (148 mL), or one 1 oz glass of hard liquor (44 mL). General instructions Schedule regular health, dental, and eye exams. Stay current with your vaccines. Tell your health care provider if: You often feel depressed. You have ever been abused or do not feel safe at home. Summary Adopting a healthy lifestyle and getting preventive care are important in promoting health and wellness. Follow your health care provider's instructions about healthy diet, exercising, and getting tested or screened for diseases. Follow your health care provider's  instructions on monitoring your cholesterol and blood pressure. This information is not intended to replace advice given to you by your health care provider. Make sure you discuss any questions you have with your health care provider. Document Revised: 05/26/2020 Document Reviewed: 03/11/2018 Elsevier Patient Education  2022 Elsevier Inc.  

## 2020-12-26 NOTE — Progress Notes (Signed)
Subjective:   Kristen Mccarthy is a 75 y.o. female who presents for Medicare Annual (Subsequent) preventive examination.  This wellness visit is conducted by a nurse.  The patient's medications were reviewed and reconciled since the patient's last visit.  History details were provided by the patient.  The history appears to be reliable.    Patient's last AWV was one year ago.   Medical History: Patient history and Family history was reviewed  Medications, Allergies, and preventative health maintenance was reviewed and updated.   Review of Systems    ROS - Negative Cardiac Risk Factors include: dyslipidemia     Objective:    Today's Vitals   12/26/20 1414  BP: 120/72  Pulse: 70  Resp: 16  Weight: 145 lb 12.8 oz (66.1 kg)  Height: 5\' 3"  (1.6 m)  PainSc: 0-No pain   Body mass index is 25.83 kg/m.  Advanced Directives 12/26/2020 12/16/2019 10/01/2017 09/16/2017  Does Patient Have a Medical Advance Directive? Yes Yes Yes Yes  Type of Advance Directive Living will;Healthcare Power of [place];Living will Living will;Healthcare Power of Attorney Living will;Healthcare Power of Attorney  Does patient want to make changes to medical advance directive? No - Patient declined - No - Patient declined No - Patient declined  Copy of [place] in Chart? No - copy requested - No - copy requested No - copy requested    Current Medications (verified) Outpatient Encounter Medications as of 12/26/2020  Medication Sig   alendronate (FOSAMAX) 70 MG tablet TAKE 1 TABLET ONCE WEEKLY   ascorbic acid (VITAMIN C) 1000 MG tablet Take by mouth.   cholecalciferol (VITAMIN D) 1000 units tablet Take 2,000 Units by mouth daily.   LORazepam (ATIVAN) 0.5 MG tablet Take 1 tablet (0.5 mg total) by mouth 2 (two) times daily as needed for anxiety.   magnesium oxide (MAG-OX) 400 (241.3 Mg) MG tablet Take 400 mg by mouth daily.   omeprazole (PRILOSEC) 40 MG capsule  Take 40 mg by mouth daily.   polyethylene glycol (MIRALAX / GLYCOLAX) packet Take 17 g by mouth daily.   [DISCONTINUED] famotidine (PEPCID) 40 MG tablet Take 1 tablet (40 mg total) by mouth daily.   [DISCONTINUED] pravastatin (PRAVACHOL) 40 MG tablet Take 1 tablet (40 mg total) by mouth daily.   No facility-administered encounter medications on file as of 12/26/2020.    Allergies (verified) Sulfa antibiotics, Azithromycin, Clindamycin/lincomycin, and Penicillins   History: Past Medical History:  Diagnosis Date   Age-related osteoporosis without current pathological fracture 07/23/2019   Breast mass, left    Calculus of gallbladder with chronic cholecystitis without obstruction 09/02/8754   Complication of anesthesia    Diffuse cystic mastopathy of right breast 07/23/2019   GERD (gastroesophageal reflux disease)    Hyperlipidemia    IBS (irritable bowel syndrome)    Mixed hyperlipidemia 07/23/2019   Osteoporosis    Other acute recurrent sinusitis 07/23/2019   PONV (postoperative nausea and vomiting)    Past Surgical History:  Procedure Laterality Date   ABDOMINAL HYSTERECTOMY     BREAST LUMPECTOMY WITH RADIOACTIVE SEED LOCALIZATION Left 10/01/2017   Procedure: BREAST LUMPECTOMY WITH RADIOACTIVE SEED LOCALIZATION;  Surgeon: Coralie Keens, MD;  Location: [facility];  Service: General;  Laterality: Left;   CHOLECYSTECTOMY     FOOT SURGERY Left    INCONTINENCE SURGERY     Family History  Problem Relation Age of Onset   Heart disease Father    Social History  Socioeconomic History   Marital status: Married    Spouse name: Champ   Number of children: 2  Tobacco Use   Smoking status: Never   Smokeless tobacco: Never  Vaping Use   Vaping Use: Never used  Substance and Sexual Activity   Alcohol use: Never   Drug use: Never   Sexual activity: Not Currently    Birth control/protection: Surgical  Social History Narrative   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,  lives in apartment with husband in lower level of son's home   Social Determinants of Health   Financial Resource Strain: Not on file  Food Insecurity: No Food Insecurity   Worried About Charity fundraiser in the Last Year: Never true   Ran Out of Food in the Last Year: Never true  Transportation Needs: No Transportation Needs   Lack of Transportation (Medical): No   Lack of Transportation (Non-Medical): No  Physical Activity: Not on file  Stress: Not on file  Social Connections: Socially Integrated   Frequency of Communication with Friends and Family: More than three times a week   Frequency of Social Gatherings with Friends and Family: More than three times a week   Attends Religious Services: More than 4 times per year   Active Member of [employer] or Organizations: Yes   Attends Music therapist: More than 4 times per year   Marital Status: Married    Tobacco Counseling Counseling given: never tobacco user   Clinical Intake:  Pre-visit preparation completed: Yes Pain : No/denies pain Pain Score: 0-No pain   BMI - recorded: 25.83 Nutritional Status: BMI 25 -29 Overweight Nutritional Risks: None Diabetes: No How often do you need to have someone help you when you read instructions, pamphlets, or other written materials from your doctor or pharmacy?: 1 - Never Interpreter Needed?: No    Activities of Daily Living In your present state of health, do you have any difficulty performing the following activities: 12/26/2020 10/10/2020  Hearing? N N  Vision? N N  Difficulty concentrating or making decisions? N N  Walking or climbing stairs? N N  Dressing or bathing? N N  Doing errands, shopping? N N  Preparing Food and eating ? N -  Using the Toilet? N -  In the past six months, have you accidently leaked urine? N -  Do you have problems with loss of bowel control? N -  Managing your Medications? N -  Managing your Finances? N -  Housekeeping or managing your  Housekeeping? N -  Some recent data might be hidden    Patient Care Team: Lillard Anes, MD as PCP - General (Family Medicine) Phylliss Blakes, OD (Optometry) Joie Bimler, MD as Consulting Physician (Urology)     Assessment:   This is a routine wellness examination for Kristen Mccarthy.   Dietary issues and exercise activities discussed: Current Exercise Habits: Home exercise routine, Type of exercise: walking, Time (Minutes): 20, Frequency (Times/Week): 4, Weekly Exercise (Minutes/Week): 80, Intensity: Mild, Exercise limited by: None identified  Depression Screen PHQ 2/9 Scores 12/26/2020 10/10/2020 12/16/2019 07/23/2019  PHQ - 2 Score 0 0 0 0    Fall Risk Fall Risk  12/26/2020 10/10/2020 12/16/2019 08/24/2019  Falls in the past year? 0 0 0 0  Number falls in past yr: 0 0 0 0  Injury with Fall? 0 0 0 0  Risk for fall due to : No Fall Risks - - -  Follow up Falls evaluation completed;Falls prevention  discussed Falls evaluation completed Falls evaluation completed Falls evaluation completed    FALL RISK PREVENTION PERTAINING TO THE HOME:  Any stairs in or around the home? No  If so, are there any without handrails? No  Home free of loose throw rugs in walkways, pet beds, electrical cords, etc? Yes  Adequate lighting in your home to reduce risk of falls? Yes   ASSISTIVE DEVICES UTILIZED TO PREVENT FALLS:  Life alert? No  Use of a cane, walker or w/c? No  Grab bars in the bathroom? Yes  Shower chair or bench in shower? No  Elevated toilet seat or a handicapped toilet? No   Gait steady and fast without use of assistive device  Cognitive Function: MMSE - Mini Mental State Exam 12/16/2019  Orientation to time 5  Orientation to Place 5  Registration 3  Attention/ Calculation 5  Recall 3  Language- name 2 objects 2  Language- repeat 1  Language- follow 3 step command 3  Language- read & follow direction 1  Write a sentence 1  Copy design 1  Total score 30     6CIT Screen  12/26/2020  What Year? 0 points  What month? 0 points  What time? 0 points  Count back from 20 0 points  Months in reverse 0 points  Repeat phrase 0 points  Total Score 0    Immunizations  There is no immunization history on file for this patient.  TDAP status: Due, Education has been provided regarding the importance of this vaccine. Advised may receive this vaccine at local pharmacy or Health Dept. Aware to provide a copy of the vaccination record if obtained from local pharmacy or Health Dept. Verbalized acceptance and understanding.  Flu Vaccine status: Declined, Education has been provided regarding the importance of this vaccine but patient still declined. Advised may receive this vaccine at local pharmacy or Health Dept. Aware to provide a copy of the vaccination record if obtained from local pharmacy or Health Dept. Verbalized acceptance and understanding.  Pneumococcal vaccine status: Declined,  Education has been provided regarding the importance of this vaccine but patient still declined. Advised may receive this vaccine at local pharmacy or Health Dept. Aware to provide a copy of the vaccination record if obtained from local pharmacy or Health Dept. Verbalized acceptance and understanding.   Covid-19 vaccine status: Declined, Education has been provided regarding the importance of this vaccine but patient still declined. Advised may receive this vaccine at local pharmacy or Health Dept.or vaccine clinic. Aware to provide a copy of the vaccination record if obtained from local pharmacy or Health Dept. Verbalized acceptance and understanding.  Qualifies for Shingles Vaccine? Yes   Zostavax completed No   Shingrix Completed?: No.    Education has been provided regarding the importance of this vaccine. Patient has been advised to call insurance company to determine out of pocket expense if they have not yet received this vaccine. Advised may also receive vaccine at local pharmacy or  Health Dept. Verbalized acceptance and understanding.  Screening Tests Health Maintenance  Topic Date Due   Hepatitis C Screening  Never done   TETANUS/TDAP  Never done   Zoster Vaccines- Shingrix (1 of 2) Never done   INFLUENZA VACCINE  Never done   COVID-19 Vaccine (1) 10/10/2021 (Originally 05/29/1946)   MAMMOGRAM  10/26/2021   DEXA SCAN  03/29/2022   HPV VACCINES  Aged Out    Health Maintenance  Health Maintenance Due  Topic Date Due   Hepatitis  C Screening  Never done   TETANUS/TDAP  Never done   Zoster Vaccines- Shingrix (1 of 2) Never done   INFLUENZA VACCINE  Never done    Colorectal cancer screening: Type of screening: Colonoscopy. No longer required  Mammogram status: Completed 04/2020. Repeat every year  Bone Density status: Completed 03/2020. Results reflect: Bone density results: OSTEOPOROSIS. Repeat every 2 years.  Lung Cancer Screening: (Low Dose CT Chest recommended if Age 65-80 years, 30 pack-year currently smoking OR have quit w/in 15years.) does not qualify.   Additional Screening:  Vision Screening: Recommended annual ophthalmology exams for early detection of glaucoma and other disorders of the eye. Is the patient up to date with their annual eye exam?  Yes  Who is the provider or what is the name of the office in which the patient attends annual eye exams? Dr Samara Snide  Dental Screening: Recommended annual dental exams for proper oral hygiene    Plan:    1- Mammogram - Bilateral Diagnostic due Jan 2023 2- Continue Fosamax for osteoporosis - Bone Density Screening due 03/2022 3- Vaccines declined (Flu, PNA, and COVID) 4- Patient stopped pravastatin due to moderate leg cramps  I have personally reviewed and noted the following in the patient's chart:   Medical and social history Use of alcohol, tobacco or illicit drugs  Current medications and supplements including opioid prescriptions.  Functional ability and status Nutritional status Physical  activity Advanced directives List of other physicians Hospitalizations, surgeries, and ER visits in previous 12 months Vitals Screenings to include cognitive, depression, and falls Referrals and appointments  In addition, I have reviewed and discussed with patient certain preventive protocols, quality metrics, and best practice recommendations. A written personalized care plan for preventive services as well as general preventive health recommendations were provided to patient.     Kristen Noe, LPN   4/00/8676

## 2020-12-27 ENCOUNTER — Other Ambulatory Visit: Payer: Self-pay | Admitting: Legal Medicine

## 2020-12-27 DIAGNOSIS — E782 Mixed hyperlipidemia: Secondary | ICD-10-CM

## 2020-12-27 MED ORDER — NEXLETOL 180 MG PO TABS: 180.0000 mg | ORAL_TABLET | Freq: Two times a day (BID) | ORAL | 2 refills | Status: DC

## 2020-12-27 NOTE — Progress Notes (Unsigned)
necletol

## 2020-12-28 ENCOUNTER — Telehealth: Payer: Self-pay

## 2020-12-28 NOTE — Telephone Encounter (Signed)
Spoke with patient - Nexletol is > $900 so she is not getting it.  She is going to work on her diet and exercise more and try to lower her cholesterol before coming back in January for blood work.

## 2021-02-06 DIAGNOSIS — R3915 Urgency of urination: Secondary | ICD-10-CM | POA: Diagnosis not present

## 2021-02-06 DIAGNOSIS — N390 Urinary tract infection, site not specified: Secondary | ICD-10-CM | POA: Diagnosis not present

## 2021-02-20 ENCOUNTER — Other Ambulatory Visit: Payer: Self-pay | Admitting: Legal Medicine

## 2021-02-20 DIAGNOSIS — E782 Mixed hyperlipidemia: Secondary | ICD-10-CM

## 2021-02-20 MED ORDER — PRALUENT 150 MG/ML ~~LOC~~ SOAJ: 150.0000 mg | SUBCUTANEOUS | 6 refills | Status: DC

## 2021-02-28 DIAGNOSIS — N390 Urinary tract infection, site not specified: Secondary | ICD-10-CM | POA: Diagnosis not present

## 2021-04-16 NOTE — Progress Notes (Signed)
Subjective:  Patient ID: Kristen Mccarthy, female    DOB: 1946-02-24  Age: 76 y.o. MRN: [id number]  Chief Complaint  Patient presents with   Hyperlipidemia   Gastroesophageal Reflux   Anxiety    HPI Hyperlipidemia: Patient is taking praluent 150 mg/ml every 14 days. She is having no problems.  Anxiety: She takes lorazepam 0.5 mg BID PRN.  GERD: Currently taking omeprazole 40 mg daily.  Patient is very concerned about her husband who is having increased memory lapses, sundowning and inappropriate anger and activity at night.  So far medications have not helped including clonazepam.  We discussed at length the need to get further work-up of him since this does seem to be related to his stroke.  We will try other medicines to see if we can help with his sundowning.   Current Outpatient Medications on File Prior to Visit  Medication Sig Dispense Refill   cephALEXin (KEFLEX) 250 MG capsule Take 250 mg by mouth daily.     cetirizine (ZYRTEC) 10 MG tablet Take 10 mg by mouth daily.     HM MAGNESIUM CITRATE PO Take 400 mg by mouth in the morning and at bedtime.     ascorbic acid (VITAMIN C) 1000 MG tablet Take by mouth.     cholecalciferol (VITAMIN D) 1000 units tablet Take 2,000 Units by mouth daily.     LORazepam (ATIVAN) 0.5 MG tablet Take 1 tablet (0.5 mg total) by mouth 2 (two) times daily as needed for anxiety. 30 tablet 1   polyethylene glycol (MIRALAX / GLYCOLAX) packet Take 17 g by mouth daily.     No current facility-administered medications on file prior to visit.   Past Medical History:  Diagnosis Date   Age-related osteoporosis without current pathological fracture 07/23/2019   Breast mass, left    Calculus of gallbladder with chronic cholecystitis without obstruction 05/08/8339   Complication of anesthesia    Diffuse cystic mastopathy of right breast 07/23/2019   GERD (gastroesophageal reflux disease)    Hyperlipidemia    IBS (irritable bowel syndrome)    Mixed  hyperlipidemia 07/23/2019   Osteoporosis    Other acute recurrent sinusitis 07/23/2019   PONV (postoperative nausea and vomiting)    Past Surgical History:  Procedure Laterality Date   ABDOMINAL HYSTERECTOMY     BREAST LUMPECTOMY WITH RADIOACTIVE SEED LOCALIZATION Left 10/01/2017   Procedure: BREAST LUMPECTOMY WITH RADIOACTIVE SEED LOCALIZATION;  Surgeon: Coralie Keens, MD;  Location: [facility];  Service: General;  Laterality: Left;   CHOLECYSTECTOMY     FOOT SURGERY Left    INCONTINENCE SURGERY      Family History  Problem Relation Age of Onset   Heart disease Father    Social History   Socioeconomic History   Marital status: Married    Spouse name: Passenger transport manager   Number of children: 2   Years of education: Not on file   Highest education level: Not on file  Occupational History   Not on file  Tobacco Use   Smoking status: Never   Smokeless tobacco: Never  Vaping Use   Vaping Use: Never used  Substance and Sexual Activity   Alcohol use: Never   Drug use: Never   Sexual activity: Not Currently    Birth control/protection: Surgical  Other Topics Concern   Not on file  Social History Narrative   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, lives in apartment with husband in lower level of son's home   Social Determinants  of Health   Financial Resource Strain: Not on file  Food Insecurity: No Food Insecurity   Worried About [place] in the Last Year: Never true   Ran Out of Food in the Last Year: Never true  Transportation Needs: No Transportation Needs   Lack of Transportation (Medical): No   Lack of Transportation (Non-Medical): No  Physical Activity: Not on file  Stress: Not on file  Social Connections: Socially Integrated   Frequency of Communication with Friends and Family: More than three times a week   Frequency of Social Gatherings with Friends and Family: More than three times a week   Attends Religious Services: More than 4 times per year    Active Member of [employer] or Organizations: Yes   Attends Music therapist: More than 4 times per year   Marital Status: Married    Review of Systems  Constitutional:  Negative for chills, fatigue and fever.  HENT:  Positive for rhinorrhea. Negative for congestion and sore throat.   Eyes:  Negative for visual disturbance.  Respiratory:  Negative for cough and shortness of breath.   Cardiovascular:  Negative for chest pain.  Gastrointestinal:  Negative for abdominal pain, constipation, diarrhea, nausea and vomiting.  Genitourinary:  Negative for dysuria and urgency.  Musculoskeletal:  Positive for arthralgias (leg and knee pain). Negative for back pain and myalgias.  Neurological:  Negative for dizziness, weakness, light-headedness and headaches.  Psychiatric/Behavioral:  Negative for dysphoric mood. The patient is not nervous/anxious.     Objective:  BP 124/70    Pulse 84    Temp 98.8 F (37.1 C)    Resp 14    Ht 5' 2.5" (1.588 m)    Wt 147 lb 3.2 oz (66.8 kg)    BMI 26.49 kg/m   BP/Weight 04/17/2021 12/26/2020 9/38/1829  Systolic BP 937 169 678  Diastolic BP 70 72 70  Wt. (Lbs) 147.2 145.8 142.4  BMI 26.49 25.83 25.23    Physical Exam Vitals reviewed.  Constitutional:      General: She is not in acute distress.    Appearance: Normal appearance. She is obese.  HENT:     Head: Normocephalic.     Right Ear: Tympanic membrane, ear canal and external ear normal.     Left Ear: Tympanic membrane, ear canal and external ear normal.     Nose: Nose normal.     Mouth/Throat:     Mouth: Mucous membranes are moist.     Pharynx: Oropharynx is clear. No posterior oropharyngeal erythema.  Eyes:     Extraocular Movements: Extraocular movements intact.     Conjunctiva/sclera: Conjunctivae normal.     Pupils: Pupils are equal, round, and reactive to light.  Neck:     Vascular: No carotid bruit.  Cardiovascular:     Rate and Rhythm: Normal rate and regular rhythm.     Pulses:  Normal pulses.     Heart sounds: No murmur heard.   Gallop present.  Pulmonary:     Effort: Pulmonary effort is normal. No respiratory distress.     Breath sounds: Normal breath sounds. No wheezing.  Abdominal:     General: Bowel sounds are normal.     Palpations: Abdomen is soft. There is no mass.  Musculoskeletal:        General: Normal range of motion.     Cervical back: Normal range of motion. No tenderness.     Right lower leg: No edema.  Left lower leg: No edema.  Skin:    General: Skin is warm.     Capillary Refill: Capillary refill takes less than 2 seconds.  Neurological:     General: No focal deficit present.     Mental Status: She is oriented to person, place, and time. Mental status is at baseline.     Gait: Gait normal.     Deep Tendon Reflexes: Reflexes normal.  Psychiatric:        Mood and Affect: Mood normal.        Behavior: Behavior normal.        Thought Content: Thought content normal.        Lab Results  Component Value Date   WBC 8.2 04/04/2020   HGB 15.9 04/04/2020   HCT 47.0 (H) 04/04/2020   PLT 269 04/04/2020   GLUCOSE 99 04/04/2020   CHOL 236 (H) 10/10/2020   TRIG 172 (H) 10/10/2020   HDL 55 10/10/2020   LDLCALC 150 (H) 10/10/2020   ALT 22 04/04/2020   AST 17 04/04/2020   NA 140 04/04/2020   K 4.1 04/04/2020   CL 104 04/04/2020   CREATININE 0.77 04/04/2020   BUN 15 04/04/2020   CO2 22 04/04/2020      Assessment & Plan:   Diagnoses and all orders for this visit: Gastroesophageal reflux disease with esophagitis without hemorrhage -     CBC with Differential/Platelet Patient has chronic GERD and is doing well with her present medications. Mixed hyperlipidemia -     Comprehensive metabolic panel -     Lipid panel -     CBC with Differential/Platelet AN INDIVIDUAL CARE PLAN for hyperlipidemia/ cholesterol was established and reinforced today.  The patient's status was assessed using clinical findings on exam, lab and other  diagnostic tests. The patient's disease status was assessed based on evidence-based guidelines and found to be well controlled. MEDICATIONS were reviewed. SELF MANAGEMENT GOALS have been discussed and patient's success at attaining the goal of low cholesterol was assessed. RECOMMENDATION given include regular exercise 3 days a week and low cholesterol/low fat diet. CLINICAL SUMMARY including written plan to identify barriers unique to the patient due to social or economic  reasons was discussed.  Age-related osteoporosis without current pathological fracture Patient has a history of osteoporosis and is being active and on alendronate.  GAD (generalized anxiety disorder) Patient has generalized anxiety disorder requiring lorazepam once or twice a day to keep her calm.  Abnormality of left breast on screening mammogram -     MM Digital Diagnostic Unilat L; Future Patient has an abnormal left breast mammogram we will schedule digital mammogram for her follow-up. Other orders -     omeprazole (PRILOSEC) 40 MG capsule; Take 1 capsule (40 mg total) by mouth daily. -     alendronate (FOSAMAX) 70 MG tablet; TAKE 1 TABLET ONCE WEEKLY  I talked to the patient at length about her husband's sundowning and abnormal behavior after his stroke which appears to be dementia although Mini-Mental status exam is 30 out of 30.  Will refer back to neurology.   .       Follow-up: Return in about 3 months (around 07/16/2021) for fasting.  An After Visit Summary was printed and given to the patient.  Reinaldo Meeker, MD [facility] [phone]

## 2021-04-17 ENCOUNTER — Encounter: Payer: Self-pay | Admitting: Legal Medicine

## 2021-04-17 ENCOUNTER — Other Ambulatory Visit: Payer: Self-pay

## 2021-04-17 ENCOUNTER — Ambulatory Visit (INDEPENDENT_AMBULATORY_CARE_PROVIDER_SITE_OTHER): Payer: Medicare HMO | Admitting: Legal Medicine

## 2021-04-17 VITALS — BP 124/70 | HR 84 | Temp 98.8°F | Resp 14 | Ht 62.5 in | Wt 147.2 lb

## 2021-04-17 DIAGNOSIS — K2100 Gastro-esophageal reflux disease with esophagitis, without bleeding: Secondary | ICD-10-CM

## 2021-04-17 DIAGNOSIS — R69 Illness, unspecified: Secondary | ICD-10-CM | POA: Diagnosis not present

## 2021-04-17 DIAGNOSIS — R928 Other abnormal and inconclusive findings on diagnostic imaging of breast: Secondary | ICD-10-CM | POA: Diagnosis not present

## 2021-04-17 DIAGNOSIS — F411 Generalized anxiety disorder: Secondary | ICD-10-CM

## 2021-04-17 DIAGNOSIS — M810 Age-related osteoporosis without current pathological fracture: Secondary | ICD-10-CM

## 2021-04-17 DIAGNOSIS — E782 Mixed hyperlipidemia: Secondary | ICD-10-CM | POA: Diagnosis not present

## 2021-04-17 MED ORDER — ALENDRONATE SODIUM 70 MG PO TABS: ORAL_TABLET | ORAL | 3 refills | Status: DC

## 2021-04-17 MED ORDER — OMEPRAZOLE 40 MG PO CPDR: 40.0000 mg | DELAYED_RELEASE_CAPSULE | Freq: Every day | ORAL | 2 refills | Status: DC

## 2021-04-18 LAB — CBC WITH DIFFERENTIAL/PLATELET
Basophils Absolute: 0 10*3/uL (ref 0.0–0.2)
Basos: 0 %
EOS (ABSOLUTE): 0 10*3/uL (ref 0.0–0.4)
Eos: 0 %
Hematocrit: 43.9 % (ref 34.0–46.6)
Hemoglobin: 15 g/dL (ref 11.1–15.9)
Immature Grans (Abs): 0 10*3/uL (ref 0.0–0.1)
Immature Granulocytes: 0 %
Lymphocytes Absolute: 1.7 10*3/uL (ref 0.7–3.1)
Lymphs: 24 %
MCH: 30.4 pg (ref 26.6–33.0)
MCHC: 34.2 g/dL (ref 31.5–35.7)
MCV: 89 fL (ref 79–97)
Monocytes Absolute: 1.1 10*3/uL — ABNORMAL HIGH (ref 0.1–0.9)
Monocytes: 15 %
Neutrophils Absolute: 4.3 10*3/uL (ref 1.4–7.0)
Neutrophils: 61 %
Platelets: 265 10*3/uL (ref 150–450)
RBC: 4.93 x10E6/uL (ref 3.77–5.28)
RDW: 11.3 % — ABNORMAL LOW (ref 11.7–15.4)
WBC: 7.2 10*3/uL (ref 3.4–10.8)

## 2021-04-18 LAB — CARDIOVASCULAR RISK ASSESSMENT

## 2021-04-18 LAB — COMPREHENSIVE METABOLIC PANEL
ALT: 25 IU/L (ref 0–32)
AST: 25 IU/L (ref 0–40)
Albumin/Globulin Ratio: 2.3 — ABNORMAL HIGH (ref 1.2–2.2)
Albumin: 4.9 g/dL — ABNORMAL HIGH (ref 3.7–4.7)
Alkaline Phosphatase: 72 IU/L (ref 44–121)
BUN/Creatinine Ratio: 17 (ref 12–28)
BUN: 13 mg/dL (ref 8–27)
Bilirubin Total: 0.5 mg/dL (ref 0.0–1.2)
CO2: 20 mmol/L (ref 20–29)
Calcium: 10.8 mg/dL — ABNORMAL HIGH (ref 8.7–10.3)
Chloride: 108 mmol/L — ABNORMAL HIGH (ref 96–106)
Creatinine, Ser: 0.76 mg/dL (ref 0.57–1.00)
Globulin, Total: 2.1 g/dL (ref 1.5–4.5)
Glucose: 108 mg/dL — ABNORMAL HIGH (ref 70–99)
Potassium: 4.6 mmol/L (ref 3.5–5.2)
Sodium: 141 mmol/L (ref 134–144)
Total Protein: 7 g/dL (ref 6.0–8.5)
eGFR: 81 mL/min/{1.73_m2} (ref >59)

## 2021-04-18 LAB — LIPID PANEL
Chol/HDL Ratio: 4.3 ratio (ref 0.0–4.4)
Cholesterol, Total: 187 mg/dL (ref 100–199)
HDL: 44 mg/dL (ref >39)
LDL Chol Calc (NIH): 120 mg/dL — ABNORMAL HIGH (ref 0–99)
Triglycerides: 127 mg/dL (ref 0–149)
VLDL Cholesterol Cal: 23 mg/dL (ref 5–40)

## 2021-04-18 NOTE — Progress Notes (Signed)
Glucose 108, kidney tests normal, calcium 10.8 chronic, liver tests normal, ldl cholesterol 120 high, cbc normal lp

## 2021-04-23 ENCOUNTER — Telehealth: Payer: Self-pay | Admitting: Legal Medicine

## 2021-04-23 NOTE — Telephone Encounter (Signed)
° °  Kristen Mccarthy has been scheduled for the following appointment:  WHAT: DIAGNOSTIC MAMMOGRAM WHERE: [facility]  DATE: 05/03/21 TIME: 1:30 PM ARRIVAL TIME  Patient has been made aware.

## 2021-04-27 DIAGNOSIS — H5211 Myopia, right eye: Secondary | ICD-10-CM | POA: Diagnosis not present

## 2021-04-27 DIAGNOSIS — Z0100 Encounter for examination of eyes and vision without abnormal findings: Secondary | ICD-10-CM | POA: Diagnosis not present

## 2021-05-03 DIAGNOSIS — R922 Inconclusive mammogram: Secondary | ICD-10-CM | POA: Diagnosis not present

## 2021-05-03 DIAGNOSIS — R928 Other abnormal and inconclusive findings on diagnostic imaging of breast: Secondary | ICD-10-CM | POA: Diagnosis not present

## 2021-05-03 DIAGNOSIS — N6489 Other specified disorders of breast: Secondary | ICD-10-CM | POA: Diagnosis not present

## 2021-05-07 ENCOUNTER — Other Ambulatory Visit: Payer: Self-pay

## 2021-05-07 DIAGNOSIS — R928 Other abnormal and inconclusive findings on diagnostic imaging of breast: Secondary | ICD-10-CM

## 2021-05-22 DIAGNOSIS — N390 Urinary tract infection, site not specified: Secondary | ICD-10-CM | POA: Diagnosis not present

## 2021-05-22 DIAGNOSIS — B3731 Acute candidiasis of vulva and vagina: Secondary | ICD-10-CM | POA: Diagnosis not present

## 2021-05-22 DIAGNOSIS — R3915 Urgency of urination: Secondary | ICD-10-CM | POA: Diagnosis not present

## 2021-07-03 ENCOUNTER — Ambulatory Visit (INDEPENDENT_AMBULATORY_CARE_PROVIDER_SITE_OTHER): Payer: Medicare HMO | Admitting: Legal Medicine

## 2021-07-03 VITALS — BP 130/68 | HR 97 | Temp 95.3°F | Ht 62.5 in | Wt 147.0 lb

## 2021-07-03 DIAGNOSIS — M180 Bilateral primary osteoarthritis of first carpometacarpal joints: Secondary | ICD-10-CM

## 2021-07-03 DIAGNOSIS — M069 Rheumatoid arthritis, unspecified: Secondary | ICD-10-CM | POA: Diagnosis not present

## 2021-07-03 MED ORDER — TRIAMCINOLONE ACETONIDE 40 MG/ML IJ SUSP
80.0000 mg | Freq: Once | INTRAMUSCULAR | Status: AC
  Administered 2021-07-03: 80 mg via INTRAMUSCULAR

## 2021-07-03 NOTE — Progress Notes (Signed)
? ?Acute Office Visit ? ?Subjective:  ? ? Patient ID: Kristen Mccarthy, female    DOB: 05-14-1945, 76 y.o.   MRN: [id number] ? ?Chief Complaint  ?Patient presents with  ? Arthritis  ? ? ?HPI: ?Patient is in today for joint pain and fibromyalgia. Patient states she hurts all over and feels she would benefit from being referred to a Rheumatologist.taking reliv food. She want to see rheumatology. GET hand x-rays.  Apparently patient goes to church with 2 nurses that work in rheumatology and convinced her she needs to see a [facility] doctor for this she has a history of osteoarthritis and I tried to explain this to her she has no history of inflammatory type arthritis such as rheumatoid or lupus.  She was unable to tolerate Naprosyn and I explained to there are other NSAIDs we could try and if necessary low-dose pain medicines.  She seemed rather oblivious to my suggestions and wants to see a specialist I recommend we get hand x-ray since she says most of her pain is in her hands she has no synovitis and has some Heberden's nodes.  She also has some fibromyalgia which is pretty quiescent at the present time she went on some type of special diet and felt better from a while taste she could not tell me but much about her diet and wanted with seem to help. ?Past Medical History:  ?Diagnosis Date  ? Age-related osteoporosis without current pathological fracture 07/23/2019  ? Breast mass, left   ? Calculus of gallbladder with chronic cholecystitis without obstruction 09/04/2015  ? Complication of anesthesia   ? Diffuse cystic mastopathy of right breast 07/23/2019  ? GERD (gastroesophageal reflux disease)   ? Hyperlipidemia   ? IBS (irritable bowel syndrome)   ? Mixed hyperlipidemia 07/23/2019  ? Osteoporosis   ? Other acute recurrent sinusitis 07/23/2019  ? PONV (postoperative nausea and vomiting)   ? ? ?Past Surgical History:  ?Procedure Laterality Date  ? ABDOMINAL HYSTERECTOMY    ? BREAST LUMPECTOMY WITH RADIOACTIVE SEED  LOCALIZATION Left 10/01/2017  ? Procedure: BREAST LUMPECTOMY WITH RADIOACTIVE SEED LOCALIZATION;  Surgeon: Coralie Keens, MD;  Location: [facility];  Service: General;  Laterality: Left;  ? CHOLECYSTECTOMY    ? FOOT SURGERY Left   ? INCONTINENCE SURGERY    ? ? ?Family History  ?Problem Relation Age of Onset  ? Heart disease Father   ? ? ?Social History  ? ?Socioeconomic History  ? Marital status: Married  ?  Spouse name: Tivis Ringer  ? Number of children: 2  ? Years of education: Not on file  ? Highest education level: Not on file  ?Occupational History  ? Not on file  ?Tobacco Use  ? Smoking status: Never  ? Smokeless tobacco: Never  ?Vaping Use  ? Vaping Use: Never used  ?Substance and Sexual Activity  ? Alcohol use: Never  ? Drug use: Never  ? Sexual activity: Not Currently  ?  Birth control/protection: Surgical  ?Other Topics Concern  ? Not on file  ?Social History Narrative  ? 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, lives in apartment with husband in lower level of son's home  ? ?Social Determinants of Health  ? ?Financial Resource Strain: Not on file  ?Food Insecurity: No Food Insecurity  ? Worried About Charity fundraiser in the Last Year: Never true  ? Ran Out of Food in the Last Year: Never true  ?Transportation Needs: No Transportation Needs  ? Lack  of Transportation (Medical): No  ? Lack of Transportation (Non-Medical): No  ?Physical Activity: Not on file  ?Stress: Not on file  ?Social Connections: Socially Integrated  ? Frequency of Communication with Friends and Family: More than three times a week  ? Frequency of Social Gatherings with Friends and Family: More than three times a week  ? Attends Religious Services: More than 4 times per year  ? Active Member of Clubs or Organizations: Yes  ? Attends Archivist Meetings: More than 4 times per year  ? Marital Status: Married  ?Intimate Partner Violence: Not At Risk  ? Fear of Current or Ex-Partner: No  ? Emotionally Abused: No  ?  Physically Abused: No  ? Sexually Abused: No  ? ? ?Outpatient Medications Prior to Visit  ?Medication Sig Dispense Refill  ? alendronate (FOSAMAX) 70 MG tablet TAKE 1 TABLET ONCE WEEKLY 12 tablet 3  ? ascorbic acid (VITAMIN C) 1000 MG tablet Take by mouth.    ? cephALEXin (KEFLEX) 250 MG capsule Take 250 mg by mouth daily.    ? cetirizine (ZYRTEC) 10 MG tablet Take 10 mg by mouth daily.    ? cholecalciferol (VITAMIN D) 1000 units tablet Take 2,000 Units by mouth daily.    ? HM MAGNESIUM CITRATE PO Take 400 mg by mouth in the morning and at bedtime.    ? LORazepam (ATIVAN) 0.5 MG tablet Take 1 tablet (0.5 mg total) by mouth 2 (two) times daily as needed for anxiety. 30 tablet 1  ? omeprazole (PRILOSEC) 40 MG capsule Take 1 capsule (40 mg total) by mouth daily. 90 capsule 2  ? polyethylene glycol (MIRALAX / GLYCOLAX) packet Take 17 g by mouth daily.    ? ?No facility-administered medications prior to visit.  ? ? ?Allergies  ?Allergen Reactions  ? Sulfa Antibiotics Other (See Comments)  ?  Stomach ache ?Stomach ache  ? Azithromycin Rash  ? Clindamycin/Lincomycin Rash  ? Penicillins Rash  ? ? ?Review of Systems  ?Constitutional:  Negative for activity change and appetite change.  ?HENT:  Negative for congestion and dental problem.   ?Eyes:  Negative for visual disturbance.  ?Respiratory:  Negative for chest tightness.   ?Cardiovascular:  Negative for chest pain and palpitations.  ?Gastrointestinal:  Negative for abdominal distention and abdominal pain.  ?Endocrine: Negative for polyuria.  ?Genitourinary:  Negative for difficulty urinating and dyspareunia.  ?Musculoskeletal:  Negative for arthralgias (diffuse) and back pain.  ?Neurological: Negative.   ?Psychiatric/Behavioral: Negative.    ? ?   ?Objective:  ?  ?Physical Exam ?Vitals reviewed.  ?Constitutional:   ?   General: She is not in acute distress. ?   Appearance: Normal appearance. She is obese. She is ill-appearing.  ?HENT:  ?   Head: Normocephalic.  ?   Right  Ear: Tympanic membrane normal.  ?   Left Ear: Tympanic membrane normal.  ?   Mouth/Throat:  ?   Mouth: Mucous membranes are moist.  ?Eyes:  ?   Extraocular Movements: Extraocular movements intact.  ?   Conjunctiva/sclera: Conjunctivae normal.  ?   Pupils: Pupils are equal, round, and reactive to light.  ?Cardiovascular:  ?   Rate and Rhythm: Normal rate and regular rhythm.  ?   Pulses: Normal pulses.  ?   Heart sounds: Normal heart sounds. No murmur heard. ?  No gallop.  ?Pulmonary:  ?   Effort: Pulmonary effort is normal. No respiratory distress.  ?   Breath sounds: Normal breath sounds.  No wheezing.  ?Abdominal:  ?   General: Abdomen is flat. Bowel sounds are normal. There is no distension.  ?   Tenderness: There is no abdominal tenderness.  ?Musculoskeletal:  ?   Cervical back: Normal range of motion and neck supple.  ?   Right lower leg: No edema.  ?   Left lower leg: No edema.  ?   Comments: No synovitis and no ulnar deviation  ?Skin: ?   General: Skin is warm and dry.  ?   Capillary Refill: Capillary refill takes less than 2 seconds.  ?Neurological:  ?   General: No focal deficit present.  ?   Mental Status: She is alert and oriented to person, place, and time. Mental status is at baseline.  ?   Gait: Gait normal.  ?   Deep Tendon Reflexes: Reflexes normal.  ? ? ?BP 130/68   Pulse 97   Temp (!) 95.3 ?F (35.2 ?C)   Ht 5' 2.5" (1.588 m)   Wt 147 lb (66.7 kg)   SpO2 98%   BMI 26.46 kg/m?  ?Wt Readings from Last 3 Encounters:  ?07/03/21 147 lb (66.7 kg)  ?04/17/21 147 lb 3.2 oz (66.8 kg)  ?12/26/20 145 lb 12.8 oz (66.1 kg)  ? ? ?Health Maintenance Due  ?Topic Date Due  ? Hepatitis C Screening  Never done  ? TETANUS/TDAP  Never done  ? Zoster Vaccines- Shingrix (1 of 2) Never done  ? Pneumonia Vaccine 5+ Years old (1 - PCV) Never done  ? ? ?There are no preventive care reminders to display for this patient. ? ? ?No results found for: TSH ?Lab Results  ?Component Value Date  ? WBC 7.2 04/17/2021  ? HGB 15.0  04/17/2021  ? HCT 43.9 04/17/2021  ? MCV 89 04/17/2021  ? PLT 265 04/17/2021  ? ?Lab Results  ?Component Value Date  ? NA 141 04/17/2021  ? K 4.6 04/17/2021  ? CO2 20 04/17/2021  ? GLUCOSE 108 (H) 04/17/2021  ? BUN 1

## 2021-07-04 ENCOUNTER — Encounter: Payer: Self-pay | Admitting: Legal Medicine

## 2021-07-04 LAB — RHEUMATOID ARTHRITIS PROFILE
Cyclic Citrullin Peptide Ab: 3 units (ref 0–19)
Rheumatoid fact SerPl-aCnc: <10 IU/mL (ref <14.0)

## 2021-07-04 LAB — CBC WITH DIFFERENTIAL/PLATELET
Basophils Absolute: 0 10*3/uL (ref 0.0–0.2)
Basos: 0 %
EOS (ABSOLUTE): 0.1 10*3/uL (ref 0.0–0.4)
Eos: 1 %
Hematocrit: 44.6 % (ref 34.0–46.6)
Hemoglobin: 15.1 g/dL (ref 11.1–15.9)
Immature Grans (Abs): 0 10*3/uL (ref 0.0–0.1)
Immature Granulocytes: 0 %
Lymphocytes Absolute: 3.3 10*3/uL — ABNORMAL HIGH (ref 0.7–3.1)
Lymphs: 42 %
MCH: 30.3 pg (ref 26.6–33.0)
MCHC: 33.9 g/dL (ref 31.5–35.7)
MCV: 89 fL (ref 79–97)
Monocytes Absolute: 0.8 10*3/uL (ref 0.1–0.9)
Monocytes: 10 %
Neutrophils Absolute: 3.7 10*3/uL (ref 1.4–7.0)
Neutrophils: 47 %
Platelets: 277 10*3/uL (ref 150–450)
RBC: 4.99 x10E6/uL (ref 3.77–5.28)
RDW: 12.1 % (ref 11.7–15.4)
WBC: 7.9 10*3/uL (ref 3.4–10.8)

## 2021-07-04 LAB — COMPREHENSIVE METABOLIC PANEL
ALT: 29 IU/L (ref 0–32)
AST: 24 IU/L (ref 0–40)
Albumin/Globulin Ratio: 2 (ref 1.2–2.2)
Albumin: 5 g/dL — ABNORMAL HIGH (ref 3.7–4.7)
Alkaline Phosphatase: 73 IU/L (ref 44–121)
BUN/Creatinine Ratio: 19 (ref 12–28)
BUN: 14 mg/dL (ref 8–27)
Bilirubin Total: 0.3 mg/dL (ref 0.0–1.2)
CO2: 20 mmol/L (ref 20–29)
Calcium: 10.9 mg/dL — ABNORMAL HIGH (ref 8.7–10.3)
Chloride: 104 mmol/L (ref 96–106)
Creatinine, Ser: 0.72 mg/dL (ref 0.57–1.00)
Globulin, Total: 2.5 g/dL (ref 1.5–4.5)
Glucose: 92 mg/dL (ref 70–99)
Potassium: 4 mmol/L (ref 3.5–5.2)
Sodium: 143 mmol/L (ref 134–144)
Total Protein: 7.5 g/dL (ref 6.0–8.5)
eGFR: 87 mL/min/{1.73_m2} (ref >59)

## 2021-07-04 LAB — C-REACTIVE PROTEIN: CRP: 2 mg/L (ref 0–10)

## 2021-07-05 ENCOUNTER — Other Ambulatory Visit: Payer: Self-pay

## 2021-07-05 NOTE — Progress Notes (Signed)
CBC normal, kidney and liver tests normal, calcium remains high, take alendronate but stop any additional calcium. Add PTH level, CRP 2 no inflammation, RA < 10, CCP 3,  ?lp

## 2021-07-10 ENCOUNTER — Other Ambulatory Visit: Payer: Medicare HMO

## 2021-07-10 ENCOUNTER — Other Ambulatory Visit: Payer: Self-pay | Admitting: Legal Medicine

## 2021-07-11 LAB — PTH, INTACT AND CALCIUM
Calcium: 10.1 mg/dL (ref 8.7–10.3)
PTH: 48 pg/mL (ref 15–65)

## 2021-07-11 NOTE — Progress Notes (Signed)
Calcium yesterday is normal, PTH level 48 which is normal ?lp

## 2021-08-02 DIAGNOSIS — R5382 Chronic fatigue, unspecified: Secondary | ICD-10-CM | POA: Diagnosis not present

## 2021-08-02 DIAGNOSIS — M2012 Hallux valgus (acquired), left foot: Secondary | ICD-10-CM | POA: Diagnosis not present

## 2021-08-02 DIAGNOSIS — E782 Mixed hyperlipidemia: Secondary | ICD-10-CM | POA: Diagnosis not present

## 2021-08-02 DIAGNOSIS — M19072 Primary osteoarthritis, left ankle and foot: Secondary | ICD-10-CM | POA: Diagnosis not present

## 2021-08-02 DIAGNOSIS — S63216A Subluxation of metacarpophalangeal joint of right little finger, initial encounter: Secondary | ICD-10-CM | POA: Diagnosis not present

## 2021-08-02 DIAGNOSIS — M4316 Spondylolisthesis, lumbar region: Secondary | ICD-10-CM | POA: Diagnosis not present

## 2021-08-02 DIAGNOSIS — M818 Other osteoporosis without current pathological fracture: Secondary | ICD-10-CM | POA: Diagnosis not present

## 2021-08-02 DIAGNOSIS — M159 Polyosteoarthritis, unspecified: Secondary | ICD-10-CM | POA: Insufficient documentation

## 2021-08-02 DIAGNOSIS — M4312 Spondylolisthesis, cervical region: Secondary | ICD-10-CM | POA: Diagnosis not present

## 2021-08-02 DIAGNOSIS — S63111A Subluxation of metacarpophalangeal joint of right thumb, initial encounter: Secondary | ICD-10-CM | POA: Diagnosis not present

## 2021-08-02 DIAGNOSIS — M069 Rheumatoid arthritis, unspecified: Secondary | ICD-10-CM | POA: Diagnosis not present

## 2021-08-02 DIAGNOSIS — M47816 Spondylosis without myelopathy or radiculopathy, lumbar region: Secondary | ICD-10-CM | POA: Diagnosis not present

## 2021-08-02 DIAGNOSIS — R5383 Other fatigue: Secondary | ICD-10-CM | POA: Diagnosis not present

## 2021-08-02 DIAGNOSIS — M4802 Spinal stenosis, cervical region: Secondary | ICD-10-CM | POA: Diagnosis not present

## 2021-08-02 DIAGNOSIS — Z9889 Other specified postprocedural states: Secondary | ICD-10-CM | POA: Insufficient documentation

## 2021-08-02 DIAGNOSIS — S63217A Subluxation of metacarpophalangeal joint of left little finger, initial encounter: Secondary | ICD-10-CM | POA: Diagnosis not present

## 2021-08-02 DIAGNOSIS — M4807 Spinal stenosis, lumbosacral region: Secondary | ICD-10-CM | POA: Diagnosis not present

## 2021-08-02 DIAGNOSIS — S63112A Subluxation of metacarpophalangeal joint of left thumb, initial encounter: Secondary | ICD-10-CM | POA: Diagnosis not present

## 2021-08-02 DIAGNOSIS — K219 Gastro-esophageal reflux disease without esophagitis: Secondary | ICD-10-CM | POA: Diagnosis not present

## 2021-08-02 DIAGNOSIS — M160 Bilateral primary osteoarthritis of hip: Secondary | ICD-10-CM | POA: Diagnosis not present

## 2021-09-11 DIAGNOSIS — M48061 Spinal stenosis, lumbar region without neurogenic claudication: Secondary | ICD-10-CM | POA: Diagnosis not present

## 2021-09-11 DIAGNOSIS — M4722 Other spondylosis with radiculopathy, cervical region: Secondary | ICD-10-CM | POA: Diagnosis not present

## 2021-09-11 DIAGNOSIS — M4726 Other spondylosis with radiculopathy, lumbar region: Secondary | ICD-10-CM | POA: Diagnosis not present

## 2021-09-11 DIAGNOSIS — M4807 Spinal stenosis, lumbosacral region: Secondary | ICD-10-CM | POA: Diagnosis not present

## 2021-09-11 DIAGNOSIS — M5416 Radiculopathy, lumbar region: Secondary | ICD-10-CM | POA: Diagnosis not present

## 2021-09-11 DIAGNOSIS — M5412 Radiculopathy, cervical region: Secondary | ICD-10-CM | POA: Diagnosis not present

## 2021-09-11 DIAGNOSIS — M1991 Primary osteoarthritis, unspecified site: Secondary | ICD-10-CM | POA: Diagnosis not present

## 2021-09-11 DIAGNOSIS — M159 Polyosteoarthritis, unspecified: Secondary | ICD-10-CM | POA: Diagnosis not present

## 2021-09-11 DIAGNOSIS — M4802 Spinal stenosis, cervical region: Secondary | ICD-10-CM | POA: Diagnosis not present

## 2021-09-11 DIAGNOSIS — M4319 Spondylolisthesis, multiple sites in spine: Secondary | ICD-10-CM | POA: Diagnosis not present

## 2021-09-17 DIAGNOSIS — M159 Polyosteoarthritis, unspecified: Secondary | ICD-10-CM | POA: Diagnosis not present

## 2021-09-17 DIAGNOSIS — M4807 Spinal stenosis, lumbosacral region: Secondary | ICD-10-CM | POA: Diagnosis not present

## 2021-09-17 DIAGNOSIS — K219 Gastro-esophageal reflux disease without esophagitis: Secondary | ICD-10-CM | POA: Diagnosis not present

## 2021-09-17 DIAGNOSIS — R5382 Chronic fatigue, unspecified: Secondary | ICD-10-CM | POA: Diagnosis not present

## 2021-09-17 DIAGNOSIS — M818 Other osteoporosis without current pathological fracture: Secondary | ICD-10-CM | POA: Diagnosis not present

## 2021-09-17 DIAGNOSIS — E782 Mixed hyperlipidemia: Secondary | ICD-10-CM | POA: Diagnosis not present

## 2021-09-17 DIAGNOSIS — M5416 Radiculopathy, lumbar region: Secondary | ICD-10-CM | POA: Diagnosis not present

## 2021-10-09 DIAGNOSIS — B3731 Acute candidiasis of vulva and vagina: Secondary | ICD-10-CM | POA: Diagnosis not present

## 2021-10-09 DIAGNOSIS — N390 Urinary tract infection, site not specified: Secondary | ICD-10-CM | POA: Diagnosis not present

## 2021-10-09 DIAGNOSIS — R3915 Urgency of urination: Secondary | ICD-10-CM | POA: Diagnosis not present

## 2021-10-16 DIAGNOSIS — M48061 Spinal stenosis, lumbar region without neurogenic claudication: Secondary | ICD-10-CM | POA: Diagnosis not present

## 2021-10-16 DIAGNOSIS — M4316 Spondylolisthesis, lumbar region: Secondary | ICD-10-CM | POA: Insufficient documentation

## 2021-10-16 DIAGNOSIS — M5416 Radiculopathy, lumbar region: Secondary | ICD-10-CM | POA: Diagnosis not present

## 2021-10-16 DIAGNOSIS — M47816 Spondylosis without myelopathy or radiculopathy, lumbar region: Secondary | ICD-10-CM | POA: Insufficient documentation

## 2021-10-23 ENCOUNTER — Ambulatory Visit: Payer: Medicare HMO | Admitting: Legal Medicine

## 2021-10-23 NOTE — Progress Notes (Signed)
Subjective:  Patient ID: Kristen Mccarthy, female    DOB: 1945/12/23  Age: 76 y.o. MRN: [id number]  Chief Complaint  Patient presents with   Gastroesophageal Reflux   Hyperlipidemia    HPI: chronic Osteoporosis: She takes Alendronate 70 mg weekly.  Patient presents with hyperlipidemia.  Compliance with treatment has been good; maintains low cholesterol diet, follows up as directed, and maintains exercise regimen.    GERD: Taking Omeprazole 40 mg daily.   Spinal stenosis will need surgery OA, RA right wrist Current Outpatient Medications on File Prior to Visit  Medication Sig Dispense Refill   alendronate (FOSAMAX) 70 MG tablet TAKE 1 TABLET ONCE WEEKLY 12 tablet 3   ascorbic acid (VITAMIN C) 1000 MG tablet Take by mouth.     cephALEXin (KEFLEX) 250 MG capsule Take 250 mg by mouth daily.     cetirizine (ZYRTEC) 10 MG tablet Take 10 mg by mouth daily.     Cholecalciferol 125 MCG (5000 UT) TABS Take 1 tablet by mouth daily.     HM MAGNESIUM CITRATE PO Take 400 mg by mouth in the morning and at bedtime.     Omega-3 1000 MG CAPS Take 1 capsule by mouth every morning.     omeprazole (PRILOSEC) 40 MG capsule Take 1 capsule (40 mg total) by mouth daily. 90 capsule 2   No current facility-administered medications on file prior to visit.   Past Medical History:  Diagnosis Date   Age-related osteoporosis without current pathological fracture 07/23/2019   Breast mass, left    Calculus of gallbladder with chronic cholecystitis without obstruction 08/02/2704   Complication of anesthesia    Diffuse cystic mastopathy of right breast 07/23/2019   GERD (gastroesophageal reflux disease)    Hyperlipidemia    IBS (irritable bowel syndrome)    Mixed hyperlipidemia 07/23/2019   Osteoporosis    Other acute recurrent sinusitis 07/23/2019   PONV (postoperative nausea and vomiting)    Past Surgical History:  Procedure Laterality Date   ABDOMINAL HYSTERECTOMY     BREAST LUMPECTOMY WITH RADIOACTIVE  SEED LOCALIZATION Left 10/01/2017   Procedure: BREAST LUMPECTOMY WITH RADIOACTIVE SEED LOCALIZATION;  Surgeon: Coralie Keens, MD;  Location: [facility];  Service: General;  Laterality: Left;   CHOLECYSTECTOMY     FOOT SURGERY Left    INCONTINENCE SURGERY      Family History  Problem Relation Age of Onset   Heart disease Father    Social History   Socioeconomic History   Marital status: Married    Spouse name: Passenger transport manager   Number of children: 2   Years of education: Not on file   Highest education level: Not on file  Occupational History   Not on file  Tobacco Use   Smoking status: Never   Smokeless tobacco: Never  Vaping Use   Vaping Use: Never used  Substance and Sexual Activity   Alcohol use: Never   Drug use: Never   Sexual activity: Not Currently    Birth control/protection: Surgical  Other Topics Concern   Not on file  Social History Narrative   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, lives in apartment with husband in lower level of son's home   Social Determinants of Health   Financial Resource Strain: Not on file  Food Insecurity: No Food Insecurity (12/26/2020)   Hunger Vital Sign    Worried About Running Out of Food in the Last Year: Never true    Ran Out of Food in the Last  Year: Never true  Transportation Needs: No Transportation Needs (12/26/2020)   PRAPARE - Hydrologist (Medical): No    Lack of Transportation (Non-Medical): No  Physical Activity: Not on file  Stress: Not on file  Social Connections: Socially Integrated (12/26/2020)   Social Connection and Isolation Panel [NHANES]    Frequency of Communication with Friends and Family: More than three times a week    Frequency of Social Gatherings with Friends and Family: More than three times a week    Attends Religious Services: More than 4 times per year    Active Member of [employer] or Organizations: Yes    Attends Music therapist: More than 4 times per  year    Marital Status: Married    Review of Systems  Constitutional:  Negative for chills, fatigue and fever.  HENT:  Negative for congestion, ear pain and sore throat.   Eyes:  Negative for visual disturbance.  Respiratory:  Negative for cough and shortness of breath.   Cardiovascular:  Negative for chest pain and palpitations.  Gastrointestinal:  Negative for abdominal pain, constipation, diarrhea, nausea and vomiting.  Endocrine: Negative for polydipsia, polyphagia and polyuria.  Genitourinary:  Negative for difficulty urinating and dysuria.  Musculoskeletal:  Positive for arthralgias and back pain. Negative for myalgias.  Skin:  Negative for rash.  Neurological:  Negative for headaches.  Psychiatric/Behavioral:  Negative for dysphoric mood. The patient is not nervous/anxious.      Objective:  BP 140/70   Pulse 78   Temp 97.8 F (36.6 C)   Resp 15   Ht 5' 2.5" (1.588 m)   Wt 148 lb (67.1 kg)   SpO2 97%   BMI 26.64 kg/m      10/24/2021    9:10 AM 07/03/2021    2:27 PM 04/17/2021    9:13 AM  BP/Weight  Systolic BP 045 409 811  Diastolic BP 70 68 70  Wt. (Lbs) 148 147 147.2  BMI 26.64 kg/m2 26.46 kg/m2 26.49 kg/m2    Physical Exam Vitals reviewed.  Constitutional:      Appearance: Normal appearance. She is obese.  HENT:     Right Ear: Tympanic membrane normal.     Left Ear: Tympanic membrane normal.  Cardiovascular:     Rate and Rhythm: Normal rate and regular rhythm.     Pulses: Normal pulses.     Heart sounds: Normal heart sounds. No murmur heard.    No gallop.  Pulmonary:     Effort: Pulmonary effort is normal. No respiratory distress.     Breath sounds: Normal breath sounds. No wheezing.  Abdominal:     General: Abdomen is flat. Bowel sounds are normal. There is no distension.     Palpations: Abdomen is soft.     Tenderness: There is no abdominal tenderness.  Musculoskeletal:     Cervical back: Normal range of motion.     Right lower leg: No edema.      Left lower leg: No edema.     Comments: Spinal stenosis with radiculopaathy  Skin:    General: Skin is warm.     Capillary Refill: Capillary refill takes less than 2 seconds.  Neurological:     General: No focal deficit present.     Mental Status: She is alert and oriented to person, place, and time. Mental status is at baseline.  Psychiatric:        Mood and Affect: Mood normal.  Lab Results  Component Value Date   WBC 5.9 10/24/2021   HGB 15.8 10/24/2021   HCT 45.8 10/24/2021   PLT 258 10/24/2021   GLUCOSE 102 (H) 10/24/2021   CHOL 223 (H) 10/24/2021   TRIG 189 (H) 10/24/2021   HDL 50 10/24/2021   LDLCALC 139 (H) 10/24/2021   ALT 22 10/24/2021   AST 22 10/24/2021   NA 142 10/24/2021   K 4.2 10/24/2021   CL 104 10/24/2021   CREATININE 0.71 10/24/2021   BUN 11 10/24/2021   CO2 22 10/24/2021      Assessment & Plan:   Problem List Items Addressed This Visit       Digestive   Functional constipation Patient on high fiber and metamucil    GERD (gastroesophageal reflux disease)   Relevant Orders   CBC with Differential/Platelet (Completed) Plan of care was formulated today.  She is doing well.  A plan of care was formulated using patient exam, tests and other sources to optimize care using evidence based information.  Recommend no smoking, no eating after supper, avoid fatty foods, elevate Head of bed, avoid tight fitting clothing.  Continue on omeprazole.      Musculoskeletal and Integument   Osteoporosis   Relevant Medications   Cholecalciferol 125 MCG (5000 UT) TABS On alendronate    Rheumatoid arthritis, seronegative, wrist, right (HCC) Rheumatology diagnosed RA seronegative in wrist, but OA in spine     Other   Mixed hyperlipidemia - Primary   Relevant Orders   Comprehensive metabolic panel (Completed)   Lipid panel (Completed) AN INDIVIDUAL CARE PLAN for hyperlipidemia/ cholesterol was established and reinforced today.  The patient's status  was assessed using clinical findings on exam, lab and other diagnostic tests. The patient's disease status was assessed based on evidence-based guidelines and found to be fair controlled. MEDICATIONS were reviewed. SELF MANAGEMENT GOALS have been discussed and patient's success at attaining the goal of low cholesterol was assessed. RECOMMENDATION given include regular exercise 3 days a week and low cholesterol/low fat diet. CLINICAL SUMMARY including written plan to identify barriers unique to the patient due to social or economic  reasons was discussed.     GAD (generalized anxiety disorder) Stable on no medicines    Spinal stenosis New diagnosis with impingement of sinal cord, plans for surgical decompression soon   Other Visit Diagnoses     Need for hepatitis C screening test       Relevant Orders   Hepatitis C Antibody (Completed)     .    Orders Placed This Encounter  Procedures   Comprehensive metabolic panel   Lipid panel   CBC with Differential/Platelet   Hepatitis C Antibody   Cardiovascular Risk Assessment     Follow-up: Return in about 6 months (around 04/26/2022).  An After Visit Summary was printed and given to the patient.  Reinaldo Meeker, MD [facility] [phone]

## 2021-10-24 ENCOUNTER — Ambulatory Visit (INDEPENDENT_AMBULATORY_CARE_PROVIDER_SITE_OTHER): Payer: Medicare HMO | Admitting: Legal Medicine

## 2021-10-24 ENCOUNTER — Encounter: Payer: Self-pay | Admitting: Legal Medicine

## 2021-10-24 VITALS — BP 140/70 | HR 78 | Temp 97.8°F | Resp 15 | Ht 62.5 in | Wt 148.0 lb

## 2021-10-24 DIAGNOSIS — M06031 Rheumatoid arthritis without rheumatoid factor, right wrist: Secondary | ICD-10-CM | POA: Diagnosis not present

## 2021-10-24 DIAGNOSIS — M810 Age-related osteoporosis without current pathological fracture: Secondary | ICD-10-CM | POA: Diagnosis not present

## 2021-10-24 DIAGNOSIS — K219 Gastro-esophageal reflux disease without esophagitis: Secondary | ICD-10-CM

## 2021-10-24 DIAGNOSIS — E782 Mixed hyperlipidemia: Secondary | ICD-10-CM | POA: Diagnosis not present

## 2021-10-24 DIAGNOSIS — M4807 Spinal stenosis, lumbosacral region: Secondary | ICD-10-CM | POA: Diagnosis not present

## 2021-10-24 DIAGNOSIS — K5904 Chronic idiopathic constipation: Secondary | ICD-10-CM | POA: Diagnosis not present

## 2021-10-24 DIAGNOSIS — F411 Generalized anxiety disorder: Secondary | ICD-10-CM

## 2021-10-24 DIAGNOSIS — R69 Illness, unspecified: Secondary | ICD-10-CM | POA: Diagnosis not present

## 2021-10-24 DIAGNOSIS — Z1159 Encounter for screening for other viral diseases: Secondary | ICD-10-CM

## 2021-10-24 DIAGNOSIS — M4800 Spinal stenosis, site unspecified: Secondary | ICD-10-CM | POA: Insufficient documentation

## 2021-10-25 LAB — LIPID PANEL
Chol/HDL Ratio: 4.5 ratio — ABNORMAL HIGH (ref 0.0–4.4)
Cholesterol, Total: 223 mg/dL — ABNORMAL HIGH (ref 100–199)
HDL: 50 mg/dL (ref >39)
LDL Chol Calc (NIH): 139 mg/dL — ABNORMAL HIGH (ref 0–99)
Triglycerides: 189 mg/dL — ABNORMAL HIGH (ref 0–149)
VLDL Cholesterol Cal: 34 mg/dL (ref 5–40)

## 2021-10-25 LAB — CBC WITH DIFFERENTIAL/PLATELET
Basophils Absolute: 0 10*3/uL (ref 0.0–0.2)
Basos: 0 %
EOS (ABSOLUTE): 0.1 10*3/uL (ref 0.0–0.4)
Eos: 1 %
Hematocrit: 45.8 % (ref 34.0–46.6)
Hemoglobin: 15.8 g/dL (ref 11.1–15.9)
Immature Grans (Abs): 0 10*3/uL (ref 0.0–0.1)
Immature Granulocytes: 0 %
Lymphocytes Absolute: 2 10*3/uL (ref 0.7–3.1)
Lymphs: 34 %
MCH: 31.7 pg (ref 26.6–33.0)
MCHC: 34.5 g/dL (ref 31.5–35.7)
MCV: 92 fL (ref 79–97)
Monocytes Absolute: 0.5 10*3/uL (ref 0.1–0.9)
Monocytes: 9 %
Neutrophils Absolute: 3.3 10*3/uL (ref 1.4–7.0)
Neutrophils: 56 %
Platelets: 258 10*3/uL (ref 150–450)
RBC: 4.98 x10E6/uL (ref 3.77–5.28)
RDW: 11.9 % (ref 11.7–15.4)
WBC: 5.9 10*3/uL (ref 3.4–10.8)

## 2021-10-25 LAB — CARDIOVASCULAR RISK ASSESSMENT

## 2021-10-25 LAB — COMPREHENSIVE METABOLIC PANEL
ALT: 22 IU/L (ref 0–32)
AST: 22 IU/L (ref 0–40)
Albumin/Globulin Ratio: 2.1 (ref 1.2–2.2)
Albumin: 4.8 g/dL (ref 3.8–4.8)
Alkaline Phosphatase: 54 IU/L (ref 44–121)
BUN/Creatinine Ratio: 15 (ref 12–28)
BUN: 11 mg/dL (ref 8–27)
Bilirubin Total: 0.5 mg/dL (ref 0.0–1.2)
CO2: 22 mmol/L (ref 20–29)
Calcium: 10.2 mg/dL (ref 8.7–10.3)
Chloride: 104 mmol/L (ref 96–106)
Creatinine, Ser: 0.71 mg/dL (ref 0.57–1.00)
Globulin, Total: 2.3 g/dL (ref 1.5–4.5)
Glucose: 102 mg/dL — ABNORMAL HIGH (ref 70–99)
Potassium: 4.2 mmol/L (ref 3.5–5.2)
Sodium: 142 mmol/L (ref 134–144)
Total Protein: 7.1 g/dL (ref 6.0–8.5)
eGFR: 88 mL/min/{1.73_m2} (ref >59)

## 2021-10-25 LAB — HEPATITIS C ANTIBODY: Hep C Virus Ab: NONREACTIVE

## 2021-10-25 NOTE — Progress Notes (Signed)
Triglycerides 189 high, LDL cholesterol 139 high recommend statin therapy, glucose 102, kidney tests normal, Liver tests normal, Hepatitis c negative lp

## 2021-10-26 DIAGNOSIS — M1990 Unspecified osteoarthritis, unspecified site: Secondary | ICD-10-CM | POA: Diagnosis not present

## 2021-10-26 DIAGNOSIS — E785 Hyperlipidemia, unspecified: Secondary | ICD-10-CM | POA: Diagnosis not present

## 2021-10-26 DIAGNOSIS — M810 Age-related osteoporosis without current pathological fracture: Secondary | ICD-10-CM | POA: Diagnosis not present

## 2021-10-26 DIAGNOSIS — N393 Stress incontinence (female) (male): Secondary | ICD-10-CM | POA: Diagnosis not present

## 2021-10-26 DIAGNOSIS — N390 Urinary tract infection, site not specified: Secondary | ICD-10-CM | POA: Diagnosis not present

## 2021-10-26 DIAGNOSIS — Z7983 Long term (current) use of bisphosphonates: Secondary | ICD-10-CM | POA: Diagnosis not present

## 2021-10-26 DIAGNOSIS — R030 Elevated blood-pressure reading, without diagnosis of hypertension: Secondary | ICD-10-CM | POA: Diagnosis not present

## 2021-10-26 DIAGNOSIS — K219 Gastro-esophageal reflux disease without esophagitis: Secondary | ICD-10-CM | POA: Diagnosis not present

## 2021-10-26 DIAGNOSIS — K5900 Constipation, unspecified: Secondary | ICD-10-CM | POA: Diagnosis not present

## 2021-10-26 DIAGNOSIS — Z8249 Family history of ischemic heart disease and other diseases of the circulatory system: Secondary | ICD-10-CM | POA: Diagnosis not present

## 2021-10-29 DIAGNOSIS — M48062 Spinal stenosis, lumbar region with neurogenic claudication: Secondary | ICD-10-CM | POA: Diagnosis not present

## 2021-10-29 DIAGNOSIS — M4316 Spondylolisthesis, lumbar region: Secondary | ICD-10-CM | POA: Diagnosis not present

## 2021-10-29 DIAGNOSIS — M5136 Other intervertebral disc degeneration, lumbar region: Secondary | ICD-10-CM | POA: Diagnosis not present

## 2021-10-29 DIAGNOSIS — M47816 Spondylosis without myelopathy or radiculopathy, lumbar region: Secondary | ICD-10-CM | POA: Diagnosis not present

## 2021-10-29 DIAGNOSIS — M5137 Other intervertebral disc degeneration, lumbosacral region: Secondary | ICD-10-CM | POA: Diagnosis not present

## 2021-10-31 NOTE — Progress Notes (Signed)
Triglycrides high 189, LDL cholesterol high 139 why can't she take a statin lp

## 2021-11-22 DIAGNOSIS — M5136 Other intervertebral disc degeneration, lumbar region: Secondary | ICD-10-CM | POA: Diagnosis not present

## 2021-11-28 DIAGNOSIS — N390 Urinary tract infection, site not specified: Secondary | ICD-10-CM | POA: Diagnosis not present

## 2021-12-13 DIAGNOSIS — R3915 Urgency of urination: Secondary | ICD-10-CM | POA: Diagnosis not present

## 2021-12-13 DIAGNOSIS — N390 Urinary tract infection, site not specified: Secondary | ICD-10-CM | POA: Diagnosis not present

## 2021-12-13 DIAGNOSIS — B3731 Acute candidiasis of vulva and vagina: Secondary | ICD-10-CM | POA: Diagnosis not present

## 2021-12-24 DIAGNOSIS — M4316 Spondylolisthesis, lumbar region: Secondary | ICD-10-CM | POA: Diagnosis not present

## 2021-12-24 DIAGNOSIS — M47816 Spondylosis without myelopathy or radiculopathy, lumbar region: Secondary | ICD-10-CM | POA: Diagnosis not present

## 2021-12-24 DIAGNOSIS — M48062 Spinal stenosis, lumbar region with neurogenic claudication: Secondary | ICD-10-CM | POA: Diagnosis not present

## 2021-12-24 DIAGNOSIS — M5136 Other intervertebral disc degeneration, lumbar region: Secondary | ICD-10-CM | POA: Diagnosis not present

## 2022-01-03 DIAGNOSIS — M5416 Radiculopathy, lumbar region: Secondary | ICD-10-CM | POA: Diagnosis not present

## 2022-01-03 DIAGNOSIS — M6281 Muscle weakness (generalized): Secondary | ICD-10-CM | POA: Diagnosis not present

## 2022-01-07 DIAGNOSIS — M5416 Radiculopathy, lumbar region: Secondary | ICD-10-CM | POA: Diagnosis not present

## 2022-01-07 DIAGNOSIS — M6281 Muscle weakness (generalized): Secondary | ICD-10-CM | POA: Diagnosis not present

## 2022-01-08 ENCOUNTER — Ambulatory Visit (INDEPENDENT_AMBULATORY_CARE_PROVIDER_SITE_OTHER): Payer: Medicare HMO

## 2022-01-08 VITALS — BP 132/74 | HR 76 | Resp 16 | Ht 62.5 in | Wt 148.0 lb

## 2022-01-08 DIAGNOSIS — Z0000 Encounter for general adult medical examination without abnormal findings: Secondary | ICD-10-CM | POA: Diagnosis not present

## 2022-01-10 DIAGNOSIS — M5416 Radiculopathy, lumbar region: Secondary | ICD-10-CM | POA: Diagnosis not present

## 2022-01-10 DIAGNOSIS — M6281 Muscle weakness (generalized): Secondary | ICD-10-CM | POA: Diagnosis not present

## 2022-01-11 NOTE — Progress Notes (Signed)
Subjective:   Rim Jammie Troup is a 76 y.o. female who presents for Medicare Annual (Subsequent) preventive examination.  This wellness visit is conducted by a nurse.  The patient's medications were reviewed and reconciled since the patient's last visit.  History details were provided by the patient.  The history appears to be reliable.    Patient's last AWV was one year ago.   Medical History: Patient history and Family history was reviewed  Medications, Allergies, and preventative health maintenance was reviewed and updated.  Cardiac Risk Factors include: dyslipidemia;advanced age (>63mn, >>18women)     Objective:    Today's Vitals   01/08/22 1400  BP: 132/74  Pulse: 76  Resp: 16  SpO2: 98%  Weight: 148 lb (67.1 kg)  Height: 5' 2.5" (1.588 m)  PainSc:    Body mass index is 26.64 kg/m.     12/26/2020    2:23 PM 12/16/2019    9:31 AM 10/01/2017   12:10 PM 09/16/2017   11:50 AM  Advanced Directives  Does Patient Have a Medical Advance Directive? Yes Yes Yes Yes  Type of Advance Directive Living will;[place]care [place];Healthcare Power of Attorney Living will;Healthcare Power of Attorney  Does patient want to make changes to medical advance directive? No - Patient declined  No - Patient declined No - Patient declined  [place]? No - copy requested  No - copy requested No - copy requested    Current Medications (verified) Outpatient Encounter Medications as of 01/08/2022  Medication Sig   alendronate (FOSAMAX) 70 MG tablet TAKE 1 TABLET ONCE WEEKLY   ascorbic acid (VITAMIN C) 1000 MG tablet Take by mouth.   Cholecalciferol 125 MCG (5000 UT) TABS Take 1 tablet by mouth daily.   gabapentin (NEURONTIN) 300 MG capsule Take 300 mg by mouth 2 (two) times daily.   HM MAGNESIUM CITRATE PO Take 400 mg by mouth in the morning and at bedtime.   methenamine (HIPREX) 1 g tablet Take  0.5 g by mouth in the morning and at bedtime.   Omega-3 1000 MG CAPS Take 1 capsule by mouth every morning.   omeprazole (PRILOSEC) 40 MG capsule Take 1 capsule (40 mg total) by mouth daily.   cetirizine (ZYRTEC) 10 MG tablet Take 10 mg by mouth daily. (Patient not taking: Reported on 01/08/2022)   [DISCONTINUED] cephALEXin (KEFLEX) 250 MG capsule Take 250 mg by mouth daily. (Patient not taking: Reported on 01/08/2022)   No facility-administered encounter medications on file as of 01/08/2022.    Allergies (verified) Azithromycin, Clindamycin/lincomycin, Penicillins, and Sulfa antibiotics   History: Past Medical History:  Diagnosis Date   Age-related osteoporosis without current pathological fracture 07/23/2019   Breast mass, left    Calculus of gallbladder with chronic cholecystitis without obstruction 69/08/7589  Complication of anesthesia    Diffuse cystic mastopathy of right breast 07/23/2019   GERD (gastroesophageal reflux disease)    Hyperlipidemia    IBS (irritable bowel syndrome)    Mixed hyperlipidemia 07/23/2019   Osteoporosis    Other acute recurrent sinusitis 07/23/2019   PONV (postoperative nausea and vomiting)    Past Surgical History:  Procedure Laterality Date   ABDOMINAL HYSTERECTOMY     BREAST LUMPECTOMY WITH RADIOACTIVE SEED LOCALIZATION Left 10/01/2017   Procedure: BREAST LUMPECTOMY WITH RADIOACTIVE SEED LOCALIZATION;  Surgeon: BCoralie Keens MD;  [place] [facility]  Service: General;  Laterality: Left;  CHOLECYSTECTOMY     FOOT SURGERY Left    INCONTINENCE SURGERY     Family History  Problem Relation Age of Onset   Heart disease Father    Social History   Socioeconomic History   Marital status: Married    Spouse name: Passenger transport manager   Number of children: 2   Years of education: Not on file   Highest education level: Not on file  Occupational History   Not on file  Tobacco Use   Smoking status: Never   Smokeless tobacco: Never  Vaping  Use   Vaping Use: Never used  Substance and Sexual Activity   Alcohol use: Never   Drug use: Never   Sexual activity: Not Currently    Birth control/protection: Surgical  Other Topics Concern   Not on file  Social History Narrative   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, lives in apartment with husband in lower level of son's home   Social Determinants of Health   Financial Resource Strain: Not on file  Food Insecurity: No Food Insecurity (12/26/2020)   Hunger Vital Sign    Worried About Running Out of Food in the Last Year: Never true    Ran Out of Food in the Last Year: Never true  Transportation Needs: No Transportation Needs (12/26/2020)   PRAPARE - Hydrologist (Medical): No    Lack of Transportation (Non-Medical): No  Physical Activity: Not on file  Stress: Not on file  Social Connections: Socially Integrated (12/26/2020)   Social Connection and Isolation Panel [NHANES]    Frequency of Communication with Friends and Family: More than three times a week    Frequency of Social Gatherings with Friends and Family: More than three times a week    Attends Religious Services: More than 4 times per year    Active Member of [employer] or Organizations: Yes    Attends Music therapist: More than 4 times per year    Marital Status: Married    Tobacco Counseling Counseling given: Not Answered   Clinical Intake:  Pre-visit preparation completed: Yes  Pain : 0-10 Pain Score: 3  Pain Type: Chronic pain Pain Location: Back   BMI - recorded: 26.64 Nutritional Status: BMI 25 -29 Overweight Nutritional Risks: None Diabetes: No  How often do you need to have someone help you when you read instructions, pamphlets, or other written materials from your doctor or pharmacy?: 1 - Never Interpreter Needed?: No    Activities of Daily Living    01/11/2022   10:21 AM  In your present state of health, do you have any difficulty performing the  following activities:  Hearing? 0  Vision? 0  Difficulty concentrating or making decisions? 0  Walking or climbing stairs? 0  Dressing or bathing? 0  Doing errands, shopping? 0  Preparing Food and eating ? N  Using the Toilet? N  In the past six months, have you accidently leaked urine? N  Do you have problems with loss of bowel control? N  Managing your Medications? N  Managing your Finances? N  Housekeeping or managing your Housekeeping? N    Patient Care Team: Lillard Anes, MD as PCP - General (Family Medicine) Phylliss Blakes, OD (Optometry) Joie Bimler, MD as Consulting Physician (Urology)     Assessment:   This is a routine wellness examination for Kayanna.  Hearing/Vision screen No results found.  Dietary issues and exercise activities discussed: Current Exercise Habits: The patient does  not participate in regular exercise at present, Exercise limited by: Other - see comments (back pain - currently doing PT)   Depression Screen    01/11/2022   10:21 AM 04/17/2021    9:21 AM 12/26/2020    2:18 PM 10/10/2020    9:00 AM 12/16/2019    9:32 AM 07/23/2019    9:33 AM  PHQ 2/9 Scores  PHQ - 2 Score 0 0 0 0 0 0    Fall Risk    01/11/2022   10:16 AM 04/17/2021    9:21 AM 12/26/2020    2:23 PM 10/10/2020    9:01 AM 12/16/2019    9:32 AM  Fall Risk   Falls in the past year? 0 0 0 0 0  Number falls in past yr: 0 0 0 0 0  Injury with Fall? 0 0 0 0 0  Risk for fall due to : No Fall Risks History of fall(s) No Fall Risks    Follow up Falls evaluation completed;Education provided  Falls evaluation completed;Falls prevention discussed Falls evaluation completed Falls evaluation completed    FALL RISK PREVENTION PERTAINING TO THE HOME:  Any stairs in or around the home? Yes  If so, are there any without handrails? No  Home free of loose throw rugs in walkways, pet beds, electrical cords, etc? Yes  Adequate lighting in your home to reduce risk of falls? Yes    ASSISTIVE DEVICES UTILIZED TO PREVENT FALLS:  Life alert? No  Use of a cane, walker or w/c? No  Grab bars in the bathroom? Yes  Shower chair or bench in shower? Yes  Elevated toilet seat or a handicapped toilet? No   Gait steady and fast without use of assistive device  Cognitive Function:    12/16/2019    9:34 AM  MMSE - Mini Mental State Exam  Orientation to time 5  Orientation to Place 5  Registration 3  Attention/ Calculation 5  Recall 3  Language- name 2 objects 2  Language- repeat 1  Language- follow 3 step command 3  Language- read & follow direction 1  Write a sentence 1  Copy design 1  Total score 30        01/11/2022   10:22 AM 12/26/2020    2:25 PM  6CIT Screen  What Year? 0 points 0 points  What month? 0 points 0 points  What time? 0 points 0 points  Count back from 20 0 points 0 points  Months in reverse 0 points 0 points  Repeat phrase 0 points 0 points  Total Score 0 points 0 points    Immunizations  There is no immunization history on file for this patient.  TDAP status: Due, Education has been provided regarding the importance of this vaccine. Advised may receive this vaccine at local pharmacy or Health Dept. Aware to provide a copy of the vaccination record if obtained from local pharmacy or Health Dept. Verbalized acceptance and understanding.  Flu Vaccine status: Declined, Education has been provided regarding the importance of this vaccine but patient still declined. Advised may receive this vaccine at local pharmacy or Health Dept. Aware to provide a copy of the vaccination record if obtained from local pharmacy or Health Dept. Verbalized acceptance and understanding.  Pneumococcal vaccine status: Declined,  Education has been provided regarding the importance of this vaccine but patient still declined. Advised may receive this vaccine at local pharmacy or Health Dept. Aware to provide a copy of the vaccination record if  obtained from local  pharmacy or Health Dept. Verbalized acceptance and understanding.   Covid-19 vaccine status: Declined, Education has been provided regarding the importance of this vaccine but patient still declined. Advised may receive this vaccine at local pharmacy or Health Dept.or vaccine clinic. Aware to provide a copy of the vaccination record if obtained from local pharmacy or Health Dept. Verbalized acceptance and understanding.  Qualifies for Shingles Vaccine? Yes   Zostavax completed No   Shingrix Completed?: No.    Education has been provided regarding the importance of this vaccine. Patient has been advised to call insurance company to determine out of pocket expense if they have not yet received this vaccine. Advised may also receive vaccine at local pharmacy or Health Dept. Verbalized acceptance and understanding.  Screening Tests Health Maintenance  Topic Date Due   COVID-19 Vaccine (1) Never done   TETANUS/TDAP  Never done   Zoster Vaccines- Shingrix (1 of 2) Never done   Pneumonia Vaccine 62+ Years old (1 - PCV) Never done   INFLUENZA VACCINE  Never done   DEXA SCAN  03/29/2022   MAMMOGRAM  05/03/2022   Hepatitis C Screening  Completed   HPV VACCINES  Aged Out   Fecal DNA (Cologuard)  Discontinued    Health Maintenance  Health Maintenance Due  Topic Date Due   COVID-19 Vaccine (1) Never done   TETANUS/TDAP  Never done   Zoster Vaccines- Shingrix (1 of 2) Never done   Pneumonia Vaccine 100+ Years old (1 - PCV) Never done   INFLUENZA VACCINE  Never done    Colorectal cancer screening: Type of screening: Cologuard. Completed 05/2019. Repeat every 3 years  Lung Cancer Screening: (Low Dose CT Chest recommended if Age 17-80 years, 30 pack-year currently smoking OR have quit w/in 15years.) does not qualify.   Additional Screening:  Vision Screening: Recommended annual ophthalmology exams for early detection of glaucoma and other disorders of the eye. Is the patient up to date with  their annual eye exam?  Yes  Who is the provider or what is the name of the office in which the patient attends annual eye exams? Dr Samara Snide  Dental Screening: Recommended annual dental exams for proper oral hygiene  Community Resource Referral / Chronic Care Management: CRR required this visit?  No   CCM required this visit?  No      Plan:    1- All vaccines declined 2- Call patient to schedule mammogram and DEXA sometime in Feb. 2024  I have personally reviewed and noted the following in the patient's chart:   Medical and social history Use of alcohol, tobacco or illicit drugs  Current medications and supplements including opioid prescriptions.  Functional ability and status Nutritional status Physical activity Advanced directives List of other physicians Hospitalizations, surgeries, and ER visits in previous 12 months Vitals Screenings to include cognitive, depression, and falls Referrals and appointments  In addition, I have reviewed and discussed with patient certain preventive protocols, quality metrics, and best practice recommendations. A written personalized care plan for preventive services as well as general preventive health recommendations were provided to patient.     Erie Noe, LPN   [phone]

## 2022-01-11 NOTE — Patient Instructions (Signed)
Kristen Mccarthy , Thank you for taking time to come for your Medicare Wellness Visit. I appreciate your ongoing commitment to your health goals. Please review the following plan we discussed and let me know if I can assist you in the future.   Screening recommendations/referrals: Mammogram: Due Bone Density: Due early next year Recommended yearly ophthalmology/optometry visit for glaucoma screening and checkup Recommended yearly dental visit for hygiene and checkup  Vaccinations: Influenza vaccine: Declined Pneumococcal vaccine: Declined Tdap vaccine: Declined Shingles vaccine: Declined    Advanced directives: Please bring a copy for your medical record   Preventive Care 65 Years and Older, Female   Preventive care refers to lifestyle choices and visits with your health care provider that can promote health and wellness.  What does preventive care include? A yearly physical exam. This is also called an annual well check. Dental exams once or twice a year. Routine eye exams. Ask your health care provider how often you should have your eyes checked. Personal lifestyle choices, including: Daily care of your teeth and gums. Regular physical activity. Eating a healthy diet. Avoiding tobacco and drug use. Limiting alcohol use. Practicing safe sex. Taking low-dose aspirin every day. Taking vitamin and mineral supplements as recommended by your health care provider.  What happens during an annual well check? The services and screenings done by your health care provider during your annual well check will depend on your age, overall health, lifestyle risk factors, and family history of disease.  Counseling Your health care provider may ask you questions about your: Alcohol use. Tobacco use. Drug use. Emotional well-being. Home and relationship well-being. Sexual activity. Eating habits. History of falls. Memory and ability to understand (cognition). Work and work  Statistician. Reproductive health.  Screening You may have the following tests or measurements: Height, weight, and BMI. Blood pressure. Lipid and cholesterol levels. These may be checked every 5 years, or more frequently if you are over 70 years old. Skin check. Lung cancer screening. You may have this screening every year starting at age 77 if you have a 30-pack-year history of smoking and currently smoke or have quit within the past 15 years. Fecal occult blood test (FOBT) of the stool. You may have this test every year starting at age 77. Flexible sigmoidoscopy or colonoscopy. You may have a sigmoidoscopy every 5 years or a colonoscopy every 10 years starting at age 77. Hepatitis C blood test. Hepatitis B blood test. Sexually transmitted disease (STD) testing. Diabetes screening. This is done by checking your blood sugar (glucose) after you have not eaten for a while (fasting). You may have this done every 1-3 years. Bone density scan. This is done to screen for osteoporosis. You may have this done starting at age 77. Mammogram. This may be done every 1-2 years. Talk to your health care provider about how often you should have regular mammograms. Talk with your health care provider about your test results, treatment options, and if necessary, the need for more tests.  Vaccines Your health care provider may recommend certain vaccines, such as: Influenza vaccine. This is recommended every year. Tetanus, diphtheria, and acellular pertussis (Tdap, Td) vaccine. You may need a Td booster every 10 years. Zoster vaccine. You may need this after age 66. Pneumococcal 13-valent conjugate (PCV13) vaccine. One dose is recommended after age 77. Pneumococcal polysaccharide (PPSV23) vaccine. One dose is recommended after age 77. Talk to your health care provider about which screenings and vaccines you need and how often you need them.  This information is not intended to replace advice given to you by  your health care provider. Make sure you discuss any questions you have with your health care provider. Document Released: 04/14/2015 Document Revised: 12/06/2015 Document Reviewed: 01/17/2015 Elsevier Interactive Patient Education  2017 [employer] Prevention in the Home  Falls can cause injuries. They can happen to people of all ages. There are many things you can do to make your home safe and to help prevent falls.  What can I do on the outside of my home? Regularly fix the edges of walkways and driveways and fix any cracks. Remove anything that might make you trip as you walk through a door, such as a raised step or threshold. Trim any bushes or trees on the path to your home. Use bright outdoor lighting. Clear any walking paths of anything that might make someone trip, such as rocks or tools. Regularly check to see if handrails are loose or broken. Make sure that both sides of any steps have handrails. Any raised decks and porches should have guardrails on the edges. Have any leaves, snow, or ice cleared regularly. Use sand or salt on walking paths during winter. Clean up any spills in your garage right away. This includes oil or grease spills.  What can I do in the bathroom? Use night lights. Install grab bars by the toilet and in the tub and shower. Do not use towel bars as grab bars. Use non-skid mats or decals in the tub or shower. If you need to sit down in the shower, use a plastic, non-slip stool. Keep the floor dry. Clean up any water that spills on the floor as soon as it happens. Remove soap buildup in the tub or shower regularly. Attach bath mats securely with double-sided non-slip rug tape. Do not have throw rugs and other things on the floor that can make you trip.  What can I do in the bedroom? Use night lights. Make sure that you have a light by your bed that is easy to reach. Do not use any sheets or blankets that are too big for your bed. They should  not hang down onto the floor. Have a firm chair that has side arms. You can use this for support while you get dressed. Do not have throw rugs and other things on the floor that can make you trip.  What can I do in the kitchen? Clean up any spills right away. Avoid walking on wet floors. Keep items that you use a lot in easy-to-reach places. If you need to reach something above you, use a strong step stool that has a grab bar. Keep electrical cords out of the way. Do not use floor polish or wax that makes floors slippery. If you must use wax, use non-skid floor wax. Do not have throw rugs and other things on the floor that can make you trip.  What can I do with my stairs? Do not leave any items on the stairs. Make sure that there are handrails on both sides of the stairs and use them. Fix handrails that are broken or loose. Make sure that handrails are as long as the stairways. Check any carpeting to make sure that it is firmly attached to the stairs. Fix any carpet that is loose or worn. Avoid having throw rugs at the top or bottom of the stairs. If you do have throw rugs, attach them to the floor with carpet tape. Make sure that  you have a light switch at the top of the stairs and the bottom of the stairs. If you do not have them, ask someone to add them for you.  What else can I do to help prevent falls? Wear shoes that: Do not have high heels. Have rubber bottoms. Are comfortable and fit you well. Are closed at the toe. Do not wear sandals. If you use a stepladder: Make sure that it is fully opened. Do not climb a closed stepladder. Make sure that both sides of the stepladder are locked into place. Ask someone to hold it for you, if possible. Clearly mark and make sure that you can see: Any grab bars or handrails. First and last steps. Where the edge of each step is. Use tools that help you move around (mobility aids) if they are needed. These  include: Canes. Walkers. Scooters. Crutches. Turn on the lights when you go into a dark area. Replace any light bulbs as soon as they burn out. Set up your furniture so you have a clear path. Avoid moving your furniture around. If any of your floors are uneven, fix them. If there are any pets around you, be aware of where they are. Review your medicines with your doctor. Some medicines can make you feel dizzy. This can increase your chance of falling. Ask your doctor what other things that you can do to help prevent falls.  This information is not intended to replace advice given to you by your health care provider. Make sure you discuss any questions you have with your health care provider. Document Released: 01/12/2009 Document Revised: 08/24/2015 Document Reviewed: 04/22/2014 Elsevier Interactive Patient Education  2017 [employer].

## 2022-01-15 DIAGNOSIS — M5416 Radiculopathy, lumbar region: Secondary | ICD-10-CM | POA: Diagnosis not present

## 2022-01-15 DIAGNOSIS — M6281 Muscle weakness (generalized): Secondary | ICD-10-CM | POA: Diagnosis not present

## 2022-01-17 DIAGNOSIS — M6281 Muscle weakness (generalized): Secondary | ICD-10-CM | POA: Diagnosis not present

## 2022-01-17 DIAGNOSIS — M5416 Radiculopathy, lumbar region: Secondary | ICD-10-CM | POA: Diagnosis not present

## 2022-01-24 DIAGNOSIS — M6281 Muscle weakness (generalized): Secondary | ICD-10-CM | POA: Diagnosis not present

## 2022-01-24 DIAGNOSIS — M5416 Radiculopathy, lumbar region: Secondary | ICD-10-CM | POA: Diagnosis not present

## 2022-01-28 DIAGNOSIS — M5136 Other intervertebral disc degeneration, lumbar region: Secondary | ICD-10-CM | POA: Diagnosis not present

## 2022-01-28 DIAGNOSIS — M47816 Spondylosis without myelopathy or radiculopathy, lumbar region: Secondary | ICD-10-CM | POA: Diagnosis not present

## 2022-01-28 DIAGNOSIS — M810 Age-related osteoporosis without current pathological fracture: Secondary | ICD-10-CM | POA: Diagnosis not present

## 2022-01-28 DIAGNOSIS — M4316 Spondylolisthesis, lumbar region: Secondary | ICD-10-CM | POA: Diagnosis not present

## 2022-01-28 DIAGNOSIS — M48062 Spinal stenosis, lumbar region with neurogenic claudication: Secondary | ICD-10-CM | POA: Diagnosis not present

## 2022-02-03 DIAGNOSIS — R051 Acute cough: Secondary | ICD-10-CM | POA: Diagnosis not present

## 2022-02-03 DIAGNOSIS — R0981 Nasal congestion: Secondary | ICD-10-CM | POA: Diagnosis not present

## 2022-02-03 DIAGNOSIS — J209 Acute bronchitis, unspecified: Secondary | ICD-10-CM | POA: Diagnosis not present

## 2022-02-05 DIAGNOSIS — R0982 Postnasal drip: Secondary | ICD-10-CM | POA: Diagnosis not present

## 2022-02-05 DIAGNOSIS — K219 Gastro-esophageal reflux disease without esophagitis: Secondary | ICD-10-CM | POA: Diagnosis not present

## 2022-02-05 DIAGNOSIS — M810 Age-related osteoporosis without current pathological fracture: Secondary | ICD-10-CM | POA: Diagnosis not present

## 2022-02-05 DIAGNOSIS — Z01818 Encounter for other preprocedural examination: Secondary | ICD-10-CM | POA: Diagnosis not present

## 2022-02-05 DIAGNOSIS — M5459 Other low back pain: Secondary | ICD-10-CM | POA: Diagnosis not present

## 2022-02-05 DIAGNOSIS — M25559 Pain in unspecified hip: Secondary | ICD-10-CM | POA: Diagnosis not present

## 2022-02-05 DIAGNOSIS — Z01812 Encounter for preprocedural laboratory examination: Secondary | ICD-10-CM | POA: Diagnosis not present

## 2022-02-05 DIAGNOSIS — M48062 Spinal stenosis, lumbar region with neurogenic claudication: Secondary | ICD-10-CM | POA: Diagnosis not present

## 2022-02-05 DIAGNOSIS — Z79899 Other long term (current) drug therapy: Secondary | ICD-10-CM | POA: Diagnosis not present

## 2022-02-05 DIAGNOSIS — N390 Urinary tract infection, site not specified: Secondary | ICD-10-CM | POA: Diagnosis not present

## 2022-02-05 DIAGNOSIS — Z7983 Long term (current) use of bisphosphonates: Secondary | ICD-10-CM | POA: Diagnosis not present

## 2022-02-07 DIAGNOSIS — Z79899 Other long term (current) drug therapy: Secondary | ICD-10-CM | POA: Diagnosis not present

## 2022-02-07 DIAGNOSIS — M810 Age-related osteoporosis without current pathological fracture: Secondary | ICD-10-CM | POA: Diagnosis not present

## 2022-02-07 DIAGNOSIS — Z780 Asymptomatic menopausal state: Secondary | ICD-10-CM | POA: Diagnosis not present

## 2022-02-07 DIAGNOSIS — Z1321 Encounter for screening for nutritional disorder: Secondary | ICD-10-CM | POA: Diagnosis not present

## 2022-02-13 DIAGNOSIS — M48061 Spinal stenosis, lumbar region without neurogenic claudication: Secondary | ICD-10-CM | POA: Diagnosis not present

## 2022-02-13 DIAGNOSIS — Z90710 Acquired absence of both cervix and uterus: Secondary | ICD-10-CM | POA: Diagnosis not present

## 2022-02-13 DIAGNOSIS — M47816 Spondylosis without myelopathy or radiculopathy, lumbar region: Secondary | ICD-10-CM | POA: Diagnosis not present

## 2022-02-13 DIAGNOSIS — M4316 Spondylolisthesis, lumbar region: Secondary | ICD-10-CM | POA: Diagnosis not present

## 2022-02-13 DIAGNOSIS — K219 Gastro-esophageal reflux disease without esophagitis: Secondary | ICD-10-CM | POA: Diagnosis not present

## 2022-02-13 DIAGNOSIS — Z9049 Acquired absence of other specified parts of digestive tract: Secondary | ICD-10-CM | POA: Diagnosis not present

## 2022-02-13 DIAGNOSIS — Z881 Allergy status to other antibiotic agents status: Secondary | ICD-10-CM | POA: Diagnosis not present

## 2022-02-13 DIAGNOSIS — Z880 Allergy status to penicillin: Secondary | ICD-10-CM | POA: Diagnosis not present

## 2022-02-13 DIAGNOSIS — Z79899 Other long term (current) drug therapy: Secondary | ICD-10-CM | POA: Diagnosis not present

## 2022-02-13 DIAGNOSIS — M5416 Radiculopathy, lumbar region: Secondary | ICD-10-CM | POA: Diagnosis not present

## 2022-02-13 DIAGNOSIS — M48062 Spinal stenosis, lumbar region with neurogenic claudication: Secondary | ICD-10-CM | POA: Diagnosis not present

## 2022-02-13 DIAGNOSIS — Z981 Arthrodesis status: Secondary | ICD-10-CM | POA: Diagnosis not present

## 2022-02-15 ENCOUNTER — Telehealth: Payer: Self-pay | Admitting: *Deleted

## 2022-02-15 ENCOUNTER — Encounter: Payer: Self-pay | Admitting: *Deleted

## 2022-02-15 DIAGNOSIS — M810 Age-related osteoporosis without current pathological fracture: Secondary | ICD-10-CM | POA: Diagnosis not present

## 2022-02-15 DIAGNOSIS — M4316 Spondylolisthesis, lumbar region: Secondary | ICD-10-CM | POA: Diagnosis not present

## 2022-02-15 DIAGNOSIS — Z7983 Long term (current) use of bisphosphonates: Secondary | ICD-10-CM | POA: Diagnosis not present

## 2022-02-15 DIAGNOSIS — K219 Gastro-esophageal reflux disease without esophagitis: Secondary | ICD-10-CM | POA: Diagnosis not present

## 2022-02-15 DIAGNOSIS — M5136 Other intervertebral disc degeneration, lumbar region: Secondary | ICD-10-CM | POA: Diagnosis not present

## 2022-02-15 DIAGNOSIS — Z981 Arthrodesis status: Secondary | ICD-10-CM | POA: Diagnosis not present

## 2022-02-15 DIAGNOSIS — M1991 Primary osteoarthritis, unspecified site: Secondary | ICD-10-CM | POA: Diagnosis not present

## 2022-02-15 DIAGNOSIS — M47816 Spondylosis without myelopathy or radiculopathy, lumbar region: Secondary | ICD-10-CM | POA: Diagnosis not present

## 2022-02-15 DIAGNOSIS — Z4789 Encounter for other orthopedic aftercare: Secondary | ICD-10-CM | POA: Diagnosis not present

## 2022-02-15 DIAGNOSIS — M48062 Spinal stenosis, lumbar region with neurogenic claudication: Secondary | ICD-10-CM | POA: Diagnosis not present

## 2022-02-15 NOTE — Patient Outreach (Signed)
Care Coordination TOC Note Transition Care Management Follow-up Telephone Call Date of discharge and from where: Thursday, 02/14/22 [facility]; Lumbar stenosis/ radiculopathy with L 4/5 posterior fusion How have you been since you were released from the hospital? "It's amazing how well I am doing-- I am not having any pain, just a little soreness; I am not having to take the pain medicine they gave me-- tylenol is handling my soreness pretty good.  My family is here spoiling me, taking such good care of me.  The surgeon told me that it was a longer surgery than planned-- and my mouth and tongue are raw and irritated from being intubated, but even that is getting better.  I am rinsing it out and it is getting better; the surgeon initially told me that I would be released from the hospital with antibiotics, but when they released me, they didn't prescribe any-- I am going to ask them about that when we have our zoom meeting next week" Any questions or concerns? Yes 1) sore mouth/ tongue post- intubation: interventions provided; recommended OTC mouth rinses if needed, recommended telling surgical provider about soreness if not better by next week- explained that prescription medication is available if surgeon deems necessary/ indicated; 2) question about need for post-op antibiotic; verified through review of EHR that no antibiotics were ordered at time of discharge; encouraged patient to send message/ call surgeon to inquire if she is concerned about this- she verbalizes plans to do so  Items Reviewed: Did the pt receive and understand the discharge instructions provided? Yes  Medications obtained and verified? Yes  Other? No  Any new allergies since your discharge? No  Dietary orders reviewed? Yes Do you have support at home? Yes  sons and daughters-in-law assisting with care needs as needed/ indicated  Home Care and Equipment/Supplies: Were home health services ordered? no If so, what is the  name of the agency? N/A  Has the agency set up a time to come to the patient's home? not applicable Were any new equipment or medical supplies ordered?  Yes: 3-in-1 BSC What is the name of the medical supply agency? "Not sure, they gave it to [place] before we left the hospital" Were you able to get the supplies/equipment? yes Do you have any questions related to the use of the equipment or supplies? No  Functional Questionnaire: (I = Independent and D = Dependent) ADLs: I  sons and daughters-in-law assisting with care needs as needed  Bathing/Dressing- I  sons and daughters-in-law assisting with care needs as needed  Meal Prep- I  sons and daughters-in-law assisting with care needs as needed  Eating- I  Maintaining continence- I  Transferring/Ambulation- I  sons and daughters-in-law assisting with care needs as needed  Managing Meds- I  sons and daughters-in-law assisting with care needs as needed  Follow up appointments reviewed:  PCP Hospital f/u appt confirmed? No  Scheduled to see - on - @ [facility] f/u appt confirmed? Yes  Scheduled to see surgical provider at [facility] on Monday 02/18/22- zoom telephone call with in person visit on Friday 03/01/22 @ 2:00 pm Are transportation arrangements needed? No  If their condition worsens, is the pt aware to call PCP or go to the Emergency Dept.? Yes Was the patient provided with contact information for the PCP's office or ED? Yes Was to pt encouraged to call back with questions or concerns? Yes  SDOH assessments and interventions completed:   Yes  Care Coordination Interventions Activated:  Yes   Care Coordination Interventions:  Provided education around interventions at home for post-intubation mouth irritation; side effects and use of narcotic pain medication post-op; process to address her questions about need for post-op antibiotic; signs/ symptoms post-op infection along with corresponding action plan; review of discharge  instructions from outside hospital visit    Encounter Outcome:  Pt. Visit Completed    Richarda Osmond Jamse Arn, RN, BSN, CCRN Alumnus RN CM Care Coordination/ Transition of [place] Management [phone]: direct office

## 2022-03-01 DIAGNOSIS — M4316 Spondylolisthesis, lumbar region: Secondary | ICD-10-CM | POA: Diagnosis not present

## 2022-03-01 DIAGNOSIS — Z9889 Other specified postprocedural states: Secondary | ICD-10-CM | POA: Diagnosis not present

## 2022-03-01 DIAGNOSIS — Z09 Encounter for follow-up examination after completed treatment for conditions other than malignant neoplasm: Secondary | ICD-10-CM | POA: Diagnosis not present

## 2022-03-01 DIAGNOSIS — M48062 Spinal stenosis, lumbar region with neurogenic claudication: Secondary | ICD-10-CM | POA: Diagnosis not present

## 2022-04-02 DIAGNOSIS — L300 Nummular dermatitis: Secondary | ICD-10-CM | POA: Diagnosis not present

## 2022-04-02 DIAGNOSIS — L299 Pruritus, unspecified: Secondary | ICD-10-CM | POA: Diagnosis not present

## 2022-04-05 DIAGNOSIS — R300 Dysuria: Secondary | ICD-10-CM | POA: Diagnosis not present

## 2022-04-05 DIAGNOSIS — N3091 Cystitis, unspecified with hematuria: Secondary | ICD-10-CM | POA: Diagnosis not present

## 2022-04-08 DIAGNOSIS — Z981 Arthrodesis status: Secondary | ICD-10-CM | POA: Diagnosis not present

## 2022-04-08 DIAGNOSIS — M4319 Spondylolisthesis, multiple sites in spine: Secondary | ICD-10-CM | POA: Diagnosis not present

## 2022-04-18 DIAGNOSIS — N816 Rectocele: Secondary | ICD-10-CM | POA: Diagnosis not present

## 2022-04-18 DIAGNOSIS — N3941 Urge incontinence: Secondary | ICD-10-CM | POA: Diagnosis not present

## 2022-04-18 DIAGNOSIS — Z87440 Personal history of urinary (tract) infections: Secondary | ICD-10-CM | POA: Diagnosis not present

## 2022-04-18 DIAGNOSIS — N3000 Acute cystitis without hematuria: Secondary | ICD-10-CM | POA: Diagnosis not present

## 2022-05-02 DIAGNOSIS — N3000 Acute cystitis without hematuria: Secondary | ICD-10-CM | POA: Diagnosis not present

## 2022-05-07 ENCOUNTER — Encounter: Payer: Self-pay | Admitting: Physician Assistant

## 2022-05-07 ENCOUNTER — Ambulatory Visit (INDEPENDENT_AMBULATORY_CARE_PROVIDER_SITE_OTHER): Payer: Medicare HMO | Admitting: Physician Assistant

## 2022-05-07 VITALS — BP 142/80 | HR 77 | Temp 97.2°F | Resp 18 | Ht 62.5 in | Wt 151.0 lb

## 2022-05-07 DIAGNOSIS — R69 Illness, unspecified: Secondary | ICD-10-CM | POA: Diagnosis not present

## 2022-05-07 DIAGNOSIS — Z789 Other specified health status: Secondary | ICD-10-CM | POA: Diagnosis not present

## 2022-05-07 DIAGNOSIS — E782 Mixed hyperlipidemia: Secondary | ICD-10-CM

## 2022-05-07 DIAGNOSIS — M810 Age-related osteoporosis without current pathological fracture: Secondary | ICD-10-CM

## 2022-05-07 DIAGNOSIS — K219 Gastro-esophageal reflux disease without esophagitis: Secondary | ICD-10-CM | POA: Diagnosis not present

## 2022-05-07 DIAGNOSIS — M4807 Spinal stenosis, lumbosacral region: Secondary | ICD-10-CM | POA: Diagnosis not present

## 2022-05-07 DIAGNOSIS — Z1231 Encounter for screening mammogram for malignant neoplasm of breast: Secondary | ICD-10-CM

## 2022-05-07 DIAGNOSIS — F411 Generalized anxiety disorder: Secondary | ICD-10-CM

## 2022-05-07 MED ORDER — LORAZEPAM 0.5 MG PO TABS: 0.5000 mg | ORAL_TABLET | Freq: Every day | ORAL | 1 refills | Status: DC | PRN

## 2022-05-07 MED ORDER — ALENDRONATE SODIUM 70 MG PO TABS: ORAL_TABLET | ORAL | 3 refills | Status: DC

## 2022-05-08 ENCOUNTER — Encounter: Payer: Self-pay | Admitting: Physician Assistant

## 2022-05-08 ENCOUNTER — Other Ambulatory Visit: Payer: Self-pay | Admitting: Physician Assistant

## 2022-05-08 DIAGNOSIS — Z789 Other specified health status: Secondary | ICD-10-CM | POA: Insufficient documentation

## 2022-05-08 DIAGNOSIS — Z1231 Encounter for screening mammogram for malignant neoplasm of breast: Secondary | ICD-10-CM | POA: Insufficient documentation

## 2022-05-08 DIAGNOSIS — M4807 Spinal stenosis, lumbosacral region: Secondary | ICD-10-CM | POA: Insufficient documentation

## 2022-05-08 DIAGNOSIS — E782 Mixed hyperlipidemia: Secondary | ICD-10-CM

## 2022-05-08 LAB — COMPREHENSIVE METABOLIC PANEL
ALT: 28 IU/L (ref 0–32)
AST: 23 IU/L (ref 0–40)
Albumin/Globulin Ratio: 1.8 (ref 1.2–2.2)
Albumin: 4.9 g/dL — ABNORMAL HIGH (ref 3.8–4.8)
Alkaline Phosphatase: 90 IU/L (ref 44–121)
BUN/Creatinine Ratio: 16 (ref 12–28)
BUN: 13 mg/dL (ref 8–27)
Bilirubin Total: 0.4 mg/dL (ref 0.0–1.2)
CO2: 19 mmol/L — ABNORMAL LOW (ref 20–29)
Calcium: 11.1 mg/dL — ABNORMAL HIGH (ref 8.7–10.3)
Chloride: 101 mmol/L (ref 96–106)
Creatinine, Ser: 0.83 mg/dL (ref 0.57–1.00)
Globulin, Total: 2.7 g/dL (ref 1.5–4.5)
Glucose: 86 mg/dL (ref 70–99)
Potassium: 4.2 mmol/L (ref 3.5–5.2)
Sodium: 143 mmol/L (ref 134–144)
Total Protein: 7.6 g/dL (ref 6.0–8.5)
eGFR: 73 mL/min/{1.73_m2} (ref >59)

## 2022-05-08 LAB — LIPID PANEL
Chol/HDL Ratio: 6 ratio — ABNORMAL HIGH (ref 0.0–4.4)
Cholesterol, Total: 289 mg/dL — ABNORMAL HIGH (ref 100–199)
HDL: 48 mg/dL (ref >39)
LDL Chol Calc (NIH): 186 mg/dL — ABNORMAL HIGH (ref 0–99)
Triglycerides: 283 mg/dL — ABNORMAL HIGH (ref 0–149)
VLDL Cholesterol Cal: 55 mg/dL — ABNORMAL HIGH (ref 5–40)

## 2022-05-08 LAB — CBC WITH DIFFERENTIAL/PLATELET
Basophils Absolute: 0 10*3/uL (ref 0.0–0.2)
Basos: 0 %
EOS (ABSOLUTE): 0.2 10*3/uL (ref 0.0–0.4)
Eos: 2 %
Hematocrit: 47.9 % — ABNORMAL HIGH (ref 34.0–46.6)
Hemoglobin: 16.3 g/dL — ABNORMAL HIGH (ref 11.1–15.9)
Immature Grans (Abs): 0.1 10*3/uL (ref 0.0–0.1)
Immature Granulocytes: 1 %
Lymphocytes Absolute: 3 10*3/uL (ref 0.7–3.1)
Lymphs: 33 %
MCH: 30.7 pg (ref 26.6–33.0)
MCHC: 34 g/dL (ref 31.5–35.7)
MCV: 90 fL (ref 79–97)
Monocytes Absolute: 1 10*3/uL — ABNORMAL HIGH (ref 0.1–0.9)
Monocytes: 11 %
Neutrophils Absolute: 4.8 10*3/uL (ref 1.4–7.0)
Neutrophils: 53 %
Platelets: 289 10*3/uL (ref 150–450)
RBC: 5.31 x10E6/uL — ABNORMAL HIGH (ref 3.77–5.28)
RDW: 11.7 % (ref 11.7–15.4)
WBC: 9.1 10*3/uL (ref 3.4–10.8)

## 2022-05-08 LAB — CARDIOVASCULAR RISK ASSESSMENT

## 2022-05-08 MED ORDER — REPATHA 140 MG/ML ~~LOC~~ SOSY: 140.0000 mg | PREFILLED_SYRINGE | SUBCUTANEOUS | 5 refills | Status: DC

## 2022-05-08 NOTE — Progress Notes (Signed)
Established Patient Office Visit  Subjective:  Patient ID: Kristen Mccarthy, female    DOB: 1946/02/26  Age: 77 y.o. MRN: [id number]  CC:  Chief Complaint  Patient presents with   Hyperlipidemia   NEW TO ME - DR Henrene Pastor PT HPI Kristen Mccarthy presents for chronic follow up  Pt with history of hyperlipidemia - currently only taking fish oil - she states she cannot tolerate statins. - is due for labwork  Pt with history of GERD - stable on omeprazole '40mg'$  qd  Pt with history of osteoporosis - she is currently taking fosamax '70mg'$  weekly - is due for dexa scan and mammogram and would like to schedule  Pt with history of chronic low back pain and takes gabapentin which helps  Pt requests refill of ativan which she has used in the past as needed for anxiety  Past Medical History:  Diagnosis Date   Age-related osteoporosis without current pathological fracture 07/23/2019   Breast mass, left    Calculus of gallbladder with chronic cholecystitis without obstruction 09/03/7844   Complication of anesthesia    Diffuse cystic mastopathy of right breast 07/23/2019   GERD (gastroesophageal reflux disease)    Hyperlipidemia    IBS (irritable bowel syndrome)    Mixed hyperlipidemia 07/23/2019   Osteoporosis    Other acute recurrent sinusitis 07/23/2019   PONV (postoperative nausea and vomiting)     Past Surgical History:  Procedure Laterality Date   ABDOMINAL HYSTERECTOMY     BREAST LUMPECTOMY WITH RADIOACTIVE SEED LOCALIZATION Left 10/01/2017   Procedure: BREAST LUMPECTOMY WITH RADIOACTIVE SEED LOCALIZATION;  Surgeon: Coralie Keens, MD;  Location: [facility];  Service: General;  Laterality: Left;   CHOLECYSTECTOMY     FOOT SURGERY Left    INCONTINENCE SURGERY      Family History  Problem Relation Age of Onset   Heart disease Father     Social History   Socioeconomic History   Marital status: Married    Spouse name: Passenger transport manager   Number of children: 2   Years  of education: Not on file   Highest education level: Not on file  Occupational History   Not on file  Tobacco Use   Smoking status: Never   Smokeless tobacco: Never  Vaping Use   Vaping Use: Never used  Substance and Sexual Activity   Alcohol use: Never   Drug use: Never   Sexual activity: Not Currently    Birth control/protection: Surgical  Other Topics Concern   Not on file  Social History Narrative   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, lives in apartment with husband in lower level of son's home   Social Determinants of Health   Financial Resource Strain: Not on file  Food Insecurity: No Food Insecurity (02/15/2022)   Hunger Vital Sign    Worried About Running Out of Food in the Last Year: Never true    Ran Out of Food in the Last Year: Never true  Transportation Needs: No Transportation Needs (02/15/2022)   PRAPARE - Hydrologist (Medical): No    Lack of Transportation (Non-Medical): No  Physical Activity: Not on file  Stress: Not on file  Social Connections: Socially Integrated (12/26/2020)   Social Connection and Isolation Panel [NHANES]    Frequency of Communication with Friends and Family: More than three times a week    Frequency of Social Gatherings with Friends and Family: More than three times a week  Attends Religious Services: More than 4 times per year    Active Member of Clubs or Organizations: Yes    Attends Archivist Meetings: More than 4 times per year    Marital Status: Married  Human resources officer Violence: Not At Risk (12/26/2020)   Humiliation, Afraid, Rape, and Kick questionnaire    Fear of Current or Ex-Partner: No    Emotionally Abused: No    Physically Abused: No    Sexually Abused: No     Current Outpatient Medications:    LORazepam (ATIVAN) 0.5 MG tablet, Take 1 tablet (0.5 mg total) by mouth daily as needed for anxiety., Disp: 30 tablet, Rfl: 1   alendronate (FOSAMAX) 70 MG tablet, TAKE 1 TABLET ONCE  WEEKLY, Disp: 12 tablet, Rfl: 3   cetirizine (ZYRTEC) 10 MG tablet, Take 10 mg by mouth daily. (Patient not taking: Reported on 01/08/2022), Disp: , Rfl:    Evolocumab (REPATHA) 140 MG/ML SOSY, Inject 140 mg into the skin every 14 (fourteen) days., Disp: 2.1 mL, Rfl: 5   gabapentin (NEURONTIN) 300 MG capsule, Take 300 mg by mouth 2 (two) times daily., Disp: , Rfl:    HM MAGNESIUM CITRATE PO, Take 400 mg by mouth in the morning and at bedtime., Disp: , Rfl:    Omega-3 1000 MG CAPS, Take 1 capsule by mouth every morning., Disp: , Rfl:    omeprazole (PRILOSEC) 40 MG capsule, Take 1 capsule (40 mg total) by mouth daily., Disp: 90 capsule, Rfl: 2   Allergies  Allergen Reactions   Azithromycin Rash and Dermatitis   Clindamycin/Lincomycin Rash   Penicillins Rash   Sulfa Antibiotics Other (See Comments)    Stomach ache Stomach ache Other reaction(s): Other (See Comments) Stomach ache Other reaction(s): Abdominal Pain, Other Stomach ache Stomach ache Stomach ache Stomach ache Stomach ache Stomach ache    ROS CONSTITUTIONAL: Negative for chills, fatigue, fever, unintentional weight gain and unintentional weight loss.  E/N/T: Negative for ear pain, nasal congestion and sore throat.  CARDIOVASCULAR: Negative for chest pain, dizziness, palpitations and pedal edema.  RESPIRATORY: Negative for recent cough and dyspnea.  GASTROINTESTINAL: Negative for abdominal pain, acid reflux symptoms, constipation, diarrhea, nausea and vomiting.  MSK: see HPI INTEGUMENTARY: Negative for rash.  NEUROLOGICAL: Negative for dizziness and headaches.  PSYCHIATRIC: see HPI       Objective:    PHYSICAL EXAM:   VS: BP (!) 142/80   Pulse 77   Temp (!) 97.2 F (36.2 C)   Resp 18   Ht 5' 2.5" (1.588 m)   Wt 151 lb (68.5 kg)   SpO2 91%   BMI 27.18 kg/m   GEN: Well nourished, well developed, in no acute distress  Cardiac: RRR; no murmurs, rubs, or gallops,no edema - Respiratory:  normal respiratory  rate and pattern with no distress - normal breath sounds with no rales, rhonchi, wheezes or rubs MS: no deformity or atrophy  Skin: warm and dry, no rash  Psych: euthymic mood, appropriate affect and demeanor    Health Maintenance Due  Topic Date Due   DTaP/Tdap/Td (1 - Tdap) Never done   DEXA SCAN  03/29/2022   MAMMOGRAM  05/03/2022    There are no preventive care reminders to display for this patient.  No results found for: "TSH" Lab Results  Component Value Date   WBC 9.1 05/07/2022   HGB 16.3 (H) 05/07/2022   HCT 47.9 (H) 05/07/2022   MCV 90 05/07/2022   PLT 289 05/07/2022   Lab  Results  Component Value Date   NA 143 05/07/2022   K 4.2 05/07/2022   CO2 19 (L) 05/07/2022   GLUCOSE 86 05/07/2022   BUN 13 05/07/2022   CREATININE 0.83 05/07/2022   BILITOT 0.4 05/07/2022   ALKPHOS 90 05/07/2022   AST 23 05/07/2022   ALT 28 05/07/2022   PROT 7.6 05/07/2022   ALBUMIN 4.9 (H) 05/07/2022   CALCIUM 11.1 (H) 05/07/2022   EGFR 73 05/07/2022   Lab Results  Component Value Date   CHOL 289 (H) 05/07/2022   Lab Results  Component Value Date   HDL 48 05/07/2022   Lab Results  Component Value Date   LDLCALC 186 (H) 05/07/2022   Lab Results  Component Value Date   TRIG 283 (H) 05/07/2022   Lab Results  Component Value Date   CHOLHDL 6.0 (H) 05/07/2022   No results found for: "HGBA1C"    Assessment & Plan:   Problem List Items Addressed This Visit       Digestive   GERD (gastroesophageal reflux disease) - Primary   Relevant Orders   Comprehensive metabolic panel (Completed) Continue omeprazole     Musculoskeletal and Integument   Osteoporosis   Relevant Medications   alendronate (FOSAMAX) 70 MG tablet   Other Relevant Orders   DG Bone Density     Other   Mixed hyperlipidemia   Relevant Orders   CBC with Differential/Platelet (Completed)   Comprehensive metabolic panel (Completed)   Lipid panel (Completed)   GAD (generalized anxiety disorder)    Relevant Medications   LORazepam (ATIVAN) 0.5 MG tablet   Visit for screening mammogram   Relevant Orders   MM DIGITAL SCREENING BILATERAL   Statin intolerance    Meds ordered this encounter  Medications   alendronate (FOSAMAX) 70 MG tablet    Sig: TAKE 1 TABLET ONCE WEEKLY    Dispense:  12 tablet    Refill:  3    Order Specific Question:   Supervising Provider    Answer:   COX, Elnita Maxwell [[id number]]   LORazepam (ATIVAN) 0.5 MG tablet    Sig: Take 1 tablet (0.5 mg total) by mouth daily as needed for anxiety.    Dispense:  30 tablet    Refill:  1    Order Specific Question:   Supervising Provider    AnswerShelton Silvas    Follow-up: Return in about 6 months (around 11/05/2022) for chronic fasting follow up .    SARA R Keandria Berrocal, PA-C

## 2022-05-10 ENCOUNTER — Telehealth: Payer: Self-pay | Admitting: Physician Assistant

## 2022-05-10 NOTE — Telephone Encounter (Signed)
   Kristen Mccarthy has been scheduled for the following appointment:  WHAT: DIAGNOSTIC MAMMOGRAM & BONE DENSITY WHERE: [facility]  DATE: 06/14/22 TIME: 10:00 AM CHECK-IN  Patient has been made aware.

## 2022-05-15 DIAGNOSIS — N3000 Acute cystitis without hematuria: Secondary | ICD-10-CM | POA: Diagnosis not present

## 2022-05-15 DIAGNOSIS — N3021 Other chronic cystitis with hematuria: Secondary | ICD-10-CM | POA: Diagnosis not present

## 2022-05-20 ENCOUNTER — Other Ambulatory Visit (HOSPITAL_COMMUNITY): Payer: Self-pay | Admitting: Urology

## 2022-05-20 DIAGNOSIS — N3000 Acute cystitis without hematuria: Secondary | ICD-10-CM

## 2022-05-21 ENCOUNTER — Other Ambulatory Visit: Payer: Medicare HMO

## 2022-05-22 ENCOUNTER — Other Ambulatory Visit: Payer: Self-pay

## 2022-05-22 DIAGNOSIS — E782 Mixed hyperlipidemia: Secondary | ICD-10-CM

## 2022-05-22 LAB — COMPREHENSIVE METABOLIC PANEL
ALT: 27 IU/L (ref 0–32)
AST: 24 IU/L (ref 0–40)
Albumin/Globulin Ratio: 2.2 (ref 1.2–2.2)
Albumin: 4.8 g/dL (ref 3.8–4.8)
Alkaline Phosphatase: 80 IU/L (ref 44–121)
BUN/Creatinine Ratio: 22 (ref 12–28)
BUN: 16 mg/dL (ref 8–27)
Bilirubin Total: 0.2 mg/dL (ref 0.0–1.2)
CO2: 22 mmol/L (ref 20–29)
Calcium: 10.6 mg/dL — ABNORMAL HIGH (ref 8.7–10.3)
Chloride: 104 mmol/L (ref 96–106)
Creatinine, Ser: 0.73 mg/dL (ref 0.57–1.00)
Globulin, Total: 2.2 g/dL (ref 1.5–4.5)
Glucose: 105 mg/dL — ABNORMAL HIGH (ref 70–99)
Potassium: 4.1 mmol/L (ref 3.5–5.2)
Sodium: 141 mmol/L (ref 134–144)
Total Protein: 7 g/dL (ref 6.0–8.5)
eGFR: 85 mL/min/{1.73_m2} (ref >59)

## 2022-05-22 MED ORDER — REPATHA 140 MG/ML ~~LOC~~ SOSY: 140.0000 mg | PREFILLED_SYRINGE | SUBCUTANEOUS | 5 refills | Status: DC

## 2022-05-30 ENCOUNTER — Ambulatory Visit (HOSPITAL_COMMUNITY)
Admission: RE | Admit: 2022-05-30 | Discharge: 2022-05-30 | Disposition: A | Discharge status: Home / Self Care | Payer: Medicare HMO | Source: Ambulatory Visit | Attending: Urology | Admitting: Urology

## 2022-05-30 ENCOUNTER — Encounter (HOSPITAL_COMMUNITY): Payer: Self-pay

## 2022-05-30 DIAGNOSIS — N3000 Acute cystitis without hematuria: Secondary | ICD-10-CM | POA: Insufficient documentation

## 2022-05-30 DIAGNOSIS — N390 Urinary tract infection, site not specified: Secondary | ICD-10-CM | POA: Diagnosis not present

## 2022-05-30 DIAGNOSIS — R310 Gross hematuria: Secondary | ICD-10-CM | POA: Diagnosis not present

## 2022-05-30 MED ORDER — IOHEXOL 300 MG/ML  SOLN
100.0000 mL | Freq: Once | INTRAMUSCULAR | Status: AC | PRN
  Administered 2022-05-30: 100 mL via INTRAVENOUS

## 2022-06-05 DIAGNOSIS — R8279 Other abnormal findings on microbiological examination of urine: Secondary | ICD-10-CM | POA: Diagnosis not present

## 2022-06-14 DIAGNOSIS — R928 Other abnormal and inconclusive findings on diagnostic imaging of breast: Secondary | ICD-10-CM | POA: Diagnosis not present

## 2022-06-14 DIAGNOSIS — M810 Age-related osteoporosis without current pathological fracture: Secondary | ICD-10-CM | POA: Diagnosis not present

## 2022-06-17 ENCOUNTER — Other Ambulatory Visit: Payer: Self-pay

## 2022-06-17 DIAGNOSIS — M810 Age-related osteoporosis without current pathological fracture: Secondary | ICD-10-CM

## 2022-06-17 DIAGNOSIS — K219 Gastro-esophageal reflux disease without esophagitis: Secondary | ICD-10-CM

## 2022-06-17 DIAGNOSIS — Z1231 Encounter for screening mammogram for malignant neoplasm of breast: Secondary | ICD-10-CM

## 2022-06-17 MED ORDER — OMEPRAZOLE 40 MG PO CPDR: 40.0000 mg | DELAYED_RELEASE_CAPSULE | Freq: Every day | ORAL | 4 refills | Status: DC

## 2022-07-02 DIAGNOSIS — Z79899 Other long term (current) drug therapy: Secondary | ICD-10-CM | POA: Diagnosis not present

## 2022-07-02 DIAGNOSIS — Z780 Asymptomatic menopausal state: Secondary | ICD-10-CM | POA: Diagnosis not present

## 2022-07-02 DIAGNOSIS — M810 Age-related osteoporosis without current pathological fracture: Secondary | ICD-10-CM | POA: Diagnosis not present

## 2022-07-02 DIAGNOSIS — Z1321 Encounter for screening for nutritional disorder: Secondary | ICD-10-CM | POA: Diagnosis not present

## 2022-07-04 DIAGNOSIS — Z79899 Other long term (current) drug therapy: Secondary | ICD-10-CM | POA: Diagnosis not present

## 2022-07-04 DIAGNOSIS — Z1321 Encounter for screening for nutritional disorder: Secondary | ICD-10-CM | POA: Diagnosis not present

## 2022-07-04 DIAGNOSIS — M810 Age-related osteoporosis without current pathological fracture: Secondary | ICD-10-CM | POA: Diagnosis not present

## 2022-07-08 DIAGNOSIS — M5416 Radiculopathy, lumbar region: Secondary | ICD-10-CM | POA: Diagnosis not present

## 2022-07-08 DIAGNOSIS — M4316 Spondylolisthesis, lumbar region: Secondary | ICD-10-CM | POA: Diagnosis not present

## 2022-07-08 DIAGNOSIS — M5136 Other intervertebral disc degeneration, lumbar region: Secondary | ICD-10-CM | POA: Diagnosis not present

## 2022-07-31 DIAGNOSIS — H5211 Myopia, right eye: Secondary | ICD-10-CM | POA: Diagnosis not present

## 2022-08-01 DIAGNOSIS — K117 Disturbances of salivary secretion: Secondary | ICD-10-CM | POA: Diagnosis not present

## 2022-08-05 ENCOUNTER — Telehealth: Payer: Self-pay

## 2022-08-05 NOTE — Telephone Encounter (Signed)
Patient called to schedule appointment for elevated BP - states that over the past few days it has been up to 140's/90's -- she also stated that she has been under a great deal of stress dealing with her husband which has dementia.  She denies any chest pain.  I don't see any cardiac history.  OK to schedule first available appointment?

## 2022-08-07 ENCOUNTER — Ambulatory Visit (INDEPENDENT_AMBULATORY_CARE_PROVIDER_SITE_OTHER): Payer: Medicare HMO | Admitting: Physician Assistant

## 2022-08-07 ENCOUNTER — Encounter: Payer: Self-pay | Admitting: Physician Assistant

## 2022-08-07 VITALS — BP 124/70 | HR 71 | Temp 97.5°F | Ht 62.5 in | Wt 151.0 lb

## 2022-08-07 DIAGNOSIS — I10 Essential (primary) hypertension: Secondary | ICD-10-CM | POA: Diagnosis not present

## 2022-08-07 DIAGNOSIS — R5383 Other fatigue: Secondary | ICD-10-CM

## 2022-08-07 DIAGNOSIS — R42 Dizziness and giddiness: Secondary | ICD-10-CM

## 2022-08-07 MED ORDER — LOSARTAN POTASSIUM 25 MG PO TABS: 25.0000 mg | ORAL_TABLET | Freq: Every day | ORAL | 1 refills | Status: DC

## 2022-08-07 NOTE — Progress Notes (Signed)
Subjective:  Patient ID: Kristen Mccarthy, female    DOB: Apr 07, 1945  Age: 77 y.o. MRN: [id number]  Chief Complaint  Patient presents with   Elevated blood pressure fatigue    HPI Pt states that she has noted for the past 6 weeks she has been very fatigued.  She has been dealing with a lot of stress caring for her husband with alzheimers.  He has gotten worse and is getting her up all hours of the night and she is not sleeping well.  She states on Saturday she felt slightly light headed and dizzy and took her bp and it was 177/91.  She has no history of hypertension.  She has continued to take bp and it was 156/91 on Sunday and today 144/91 at home.  She did not bring her bp cuff with her today She denies chest pain, palpitations, shortness of breath Today she is not dizzy but just feeling tired     08/07/2022   10:46 AM 02/15/2022   10:45 AM 01/11/2022   10:21 AM 04/17/2021    9:21 AM 12/26/2020    2:18 PM  Depression screen PHQ 2/9  Decreased Interest 1 0 0 0 0  Down, Depressed, Hopeless 0 0 0  0  PHQ - 2 Score 1 0 0 0 0  Altered sleeping 1      Tired, decreased energy 1      Change in appetite 1      Feeling bad or failure about yourself  0      Trouble concentrating 0      Moving slowly or fidgety/restless 0      Suicidal thoughts 0      PHQ-9 Score 4      Difficult doing work/chores Not difficult at all            12/26/2020    2:23 PM 04/17/2021    9:21 AM 01/11/2022   10:16 AM 05/07/2022    9:08 AM 08/07/2022   10:46 AM  Fall Risk  Falls in the past year? 0 0 0 1 1  Was there an injury with Fall? 0 0 0 0 0  Fall Risk Category Calculator 0 0 0 1 1  Fall Risk Category (Retired) Low Low Low    (RETIRED) Patient Fall Risk Level Low fall risk  Low fall risk    Patient at Risk for Falls Due to No Fall Risks History of fall(s) No Fall Risks No Fall Risks History of fall(s)  Fall risk Follow up Falls evaluation completed;Falls prevention discussed  Falls evaluation  completed;Education provided  Falls evaluation completed     ROS CONSTITUTIONAL:- see HPI E/N/T: Negative for ear pain, nasal congestion and sore throat.  CARDIOVASCULAR: Negative for chest pain, palpitations and pedal edema.  RESPIRATORY: Negative for recent cough and dyspnea.  GASTROINTESTINAL: Negative for abdominal pain, acid reflux symptoms, constipation, diarrhea, nausea and vomiting.  NEUROLOGICAL: see HPI PSYCHIATRIC: Negative for sleep disturbance and to question depression screen.  Negative for depression, negative for anhedonia.    Current Outpatient Medications:    cetirizine (ZYRTEC) 10 MG tablet, Take 10 mg by mouth daily., Disp: , Rfl:    Evolocumab (REPATHA) 140 MG/ML SOSY, Inject 140 mg into the skin every 14 (fourteen) days., Disp: 2.1 mL, Rfl: 5   gabapentin (NEURONTIN) 300 MG capsule, Take 300 mg by mouth 2 (two) times daily., Disp: , Rfl:    HM MAGNESIUM CITRATE PO, Take 400 mg by mouth in the  morning and at bedtime., Disp: , Rfl:    LORazepam (ATIVAN) 0.5 MG tablet, Take 1 tablet (0.5 mg total) by mouth daily as needed for anxiety., Disp: 30 tablet, Rfl: 1   losartan (COZAAR) 25 MG tablet, Take 1 tablet (25 mg total) by mouth daily., Disp: 30 tablet, Rfl: 1   Omega-3 1000 MG CAPS, Take 1 capsule by mouth every morning., Disp: , Rfl:    omeprazole (PRILOSEC) 40 MG capsule, Take 1 capsule (40 mg total) by mouth daily., Disp: 90 capsule, Rfl: 4  Past Medical History:  Diagnosis Date   Age-related osteoporosis without current pathological fracture 07/23/2019   Breast mass, left    Calculus of gallbladder with chronic cholecystitis without obstruction 09/04/2015   Complication of anesthesia    Diffuse cystic mastopathy of right breast 07/23/2019   GERD (gastroesophageal reflux disease)    Hyperlipidemia    IBS (irritable bowel syndrome)    Mixed hyperlipidemia 07/23/2019   Osteoporosis    Other acute recurrent sinusitis 07/23/2019   PONV (postoperative nausea and  vomiting)    Objective:  PHYSICAL EXAM:   BP 124/70 (BP Location: Left Arm, Patient Position: Sitting, Cuff Size: Normal)   Pulse 71   Temp (!) 97.5 F (36.4 C) (Temporal)   Ht 5' 2.5" (1.588 m)   Wt 151 lb (68.5 kg)   SpO2 97%   BMI 27.18 kg/m    GEN: Well nourished, well developed, in no acute distress  HEENT: normal external ears and nose - normal external auditory canals and TMS -  - Lips, Teeth and Gums - normal  Oropharynx - normal mucosa, palate, and posterior pharynx Cardiac: RRR; no murmurs, rubs, or gallops,no edema -  Respiratory:  normal respiratory rate and pattern with no distress - normal breath sounds with no rales, rhonchi, wheezes or rubs GI: normal bowel sounds, no masses or tenderness Skin: warm and dry, no rash  Neuro:  Alert and Oriented x 3,  - CN II-Xii grossly intact Psych: euthymic mood, appropriate affect and demeanor  EKG normal Assessment & Plan:    Other fatigue -     EKG 12-Lead -     CBC with Differential/Platelet -     Comprehensive metabolic panel -     TSH  Dizziness -     EKG 12-Lead -     CBC with Differential/Platelet -     Comprehensive metabolic panel -     TSH  Primary hypertension -     Losartan Potassium; Take 1 tablet (25 mg total) by mouth daily.  Dispense: 30 tablet; Refill: 1     Follow-up: Return in about 3 weeks (around 08/28/2022) for follow up.  An After Visit Summary was printed and given to the patient.  Jettie Pagan [facility] [phone]

## 2022-08-08 LAB — TSH: TSH: 1.19 u[IU]/mL (ref 0.450–4.500)

## 2022-08-08 LAB — COMPREHENSIVE METABOLIC PANEL
ALT: 34 IU/L — ABNORMAL HIGH (ref 0–32)
AST: 28 IU/L (ref 0–40)
Albumin/Globulin Ratio: 2 (ref 1.2–2.2)
Albumin: 4.6 g/dL (ref 3.8–4.8)
Alkaline Phosphatase: 84 IU/L (ref 44–121)
BUN/Creatinine Ratio: 20 (ref 12–28)
BUN: 14 mg/dL (ref 8–27)
Bilirubin Total: 0.4 mg/dL (ref 0.0–1.2)
CO2: 22 mmol/L (ref 20–29)
Calcium: 10.2 mg/dL (ref 8.7–10.3)
Chloride: 102 mmol/L (ref 96–106)
Creatinine, Ser: 0.71 mg/dL (ref 0.57–1.00)
Globulin, Total: 2.3 g/dL (ref 1.5–4.5)
Glucose: 96 mg/dL (ref 70–99)
Potassium: 4.5 mmol/L (ref 3.5–5.2)
Sodium: 141 mmol/L (ref 134–144)
Total Protein: 6.9 g/dL (ref 6.0–8.5)
eGFR: 88 mL/min/{1.73_m2} (ref >59)

## 2022-08-08 LAB — CBC WITH DIFFERENTIAL/PLATELET
Basophils Absolute: 0 10*3/uL (ref 0.0–0.2)
Basos: 0 %
EOS (ABSOLUTE): 0.1 10*3/uL (ref 0.0–0.4)
Eos: 2 %
Hematocrit: 44 % (ref 34.0–46.6)
Hemoglobin: 14.8 g/dL (ref 11.1–15.9)
Immature Grans (Abs): 0 10*3/uL (ref 0.0–0.1)
Immature Granulocytes: 0 %
Lymphocytes Absolute: 2.4 10*3/uL (ref 0.7–3.1)
Lymphs: 32 %
MCH: 31.3 pg (ref 26.6–33.0)
MCHC: 33.6 g/dL (ref 31.5–35.7)
MCV: 93 fL (ref 79–97)
Monocytes Absolute: 0.9 10*3/uL (ref 0.1–0.9)
Monocytes: 12 %
Neutrophils Absolute: 4 10*3/uL (ref 1.4–7.0)
Neutrophils: 54 %
Platelets: 238 10*3/uL (ref 150–450)
RBC: 4.73 x10E6/uL (ref 3.77–5.28)
RDW: 11.9 % (ref 11.7–15.4)
WBC: 7.5 10*3/uL (ref 3.4–10.8)

## 2022-08-09 DIAGNOSIS — M4800 Spinal stenosis, site unspecified: Secondary | ICD-10-CM | POA: Diagnosis not present

## 2022-08-09 DIAGNOSIS — I129 Hypertensive chronic kidney disease with stage 1 through stage 4 chronic kidney disease, or unspecified chronic kidney disease: Secondary | ICD-10-CM | POA: Diagnosis not present

## 2022-08-09 DIAGNOSIS — K5900 Constipation, unspecified: Secondary | ICD-10-CM | POA: Diagnosis not present

## 2022-08-09 DIAGNOSIS — N3946 Mixed incontinence: Secondary | ICD-10-CM | POA: Diagnosis not present

## 2022-08-09 DIAGNOSIS — M069 Rheumatoid arthritis, unspecified: Secondary | ICD-10-CM | POA: Diagnosis not present

## 2022-08-09 DIAGNOSIS — M810 Age-related osteoporosis without current pathological fracture: Secondary | ICD-10-CM | POA: Diagnosis not present

## 2022-08-09 DIAGNOSIS — N182 Chronic kidney disease, stage 2 (mild): Secondary | ICD-10-CM | POA: Diagnosis not present

## 2022-08-09 DIAGNOSIS — Z008 Encounter for other general examination: Secondary | ICD-10-CM | POA: Diagnosis not present

## 2022-08-09 DIAGNOSIS — Z8249 Family history of ischemic heart disease and other diseases of the circulatory system: Secondary | ICD-10-CM | POA: Diagnosis not present

## 2022-08-09 DIAGNOSIS — K219 Gastro-esophageal reflux disease without esophagitis: Secondary | ICD-10-CM | POA: Diagnosis not present

## 2022-08-09 DIAGNOSIS — E785 Hyperlipidemia, unspecified: Secondary | ICD-10-CM | POA: Diagnosis not present

## 2022-08-09 DIAGNOSIS — M1990 Unspecified osteoarthritis, unspecified site: Secondary | ICD-10-CM | POA: Diagnosis not present

## 2022-08-09 DIAGNOSIS — F411 Generalized anxiety disorder: Secondary | ICD-10-CM | POA: Diagnosis not present

## 2022-08-12 ENCOUNTER — Telehealth: Payer: Self-pay

## 2022-08-12 NOTE — Telephone Encounter (Signed)
Patient was seen on 08/07/22 by Kristen Sofia, PA-C. Called today stating she is still having high blood pressures last night it was 179/92 and this morning after her medication it was 148/92.  Patient stated she takes her losartan 25 mg at night.   Patient made aware that if she start having tingling, numbness, chest pain than to go to the nearest emergency room.   Please advise.

## 2022-08-12 NOTE — Telephone Encounter (Signed)
Increase losartan to 25 mg twice daily.  Patient notified.  Dr. Sedalia Muta

## 2022-08-13 DIAGNOSIS — M810 Age-related osteoporosis without current pathological fracture: Secondary | ICD-10-CM | POA: Diagnosis not present

## 2022-08-13 DIAGNOSIS — Z7983 Long term (current) use of bisphosphonates: Secondary | ICD-10-CM | POA: Diagnosis not present

## 2022-08-27 ENCOUNTER — Ambulatory Visit (INDEPENDENT_AMBULATORY_CARE_PROVIDER_SITE_OTHER): Payer: Medicare HMO | Admitting: Physician Assistant

## 2022-08-27 ENCOUNTER — Encounter: Payer: Self-pay | Admitting: Physician Assistant

## 2022-08-27 VITALS — BP 110/74 | HR 93 | Temp 97.4°F | Ht 62.5 in | Wt 151.2 lb

## 2022-08-27 DIAGNOSIS — I10 Essential (primary) hypertension: Secondary | ICD-10-CM | POA: Diagnosis not present

## 2022-08-27 MED ORDER — LOSARTAN POTASSIUM 50 MG PO TABS: 50.0000 mg | ORAL_TABLET | Freq: Every day | ORAL | 1 refills | Status: DC

## 2022-08-27 NOTE — Progress Notes (Signed)
   Acute Office Visit  Subjective:    Patient ID: Kristen Mccarthy, female    DOB: 05-24-1945, 77 y.o.   MRN: [id number]  Chief Complaint  Patient presents with   Hypertension    HPI: Patient is in today for follow up hypertension - pt states that her bp at home has been running normal - denies chest pain or dyspnea - is tolerating cozaar 25mg  bid    Current Outpatient Medications:    cetirizine (ZYRTEC) 10 MG tablet, Take 10 mg by mouth daily., Disp: , Rfl:    Evolocumab (REPATHA) 140 MG/ML SOSY, Inject 140 mg into the skin every 14 (fourteen) days., Disp: 2.1 mL, Rfl: 5   gabapentin (NEURONTIN) 300 MG capsule, Take 300 mg by mouth 2 (two) times daily., Disp: , Rfl:    HM MAGNESIUM CITRATE PO, Take 400 mg by mouth in the morning and at bedtime., Disp: , Rfl:    LORazepam (ATIVAN) 0.5 MG tablet, Take 1 tablet (0.5 mg total) by mouth daily as needed for anxiety., Disp: 30 tablet, Rfl: 1   losartan (COZAAR) 50 MG tablet, Take 1 tablet (50 mg total) by mouth daily., Disp: 90 tablet, Rfl: 1   Omega-3 1000 MG CAPS, Take 1 capsule by mouth every morning., Disp: , Rfl:    omeprazole (PRILOSEC) 40 MG capsule, Take 1 capsule (40 mg total) by mouth daily., Disp: 90 capsule, Rfl: 4  Allergies  Allergen Reactions   Azithromycin Rash and Dermatitis   Clindamycin/Lincomycin Rash   Penicillins Rash   Sulfa Antibiotics Other (See Comments)    Stomach ache Stomach ache Other reaction(s): Other (See Comments) Stomach ache Other reaction(s): Abdominal Pain, Other Stomach ache Stomach ache Stomach ache Stomach ache Stomach ache Stomach ache    ROS CONSTITUTIONAL: Negative for chills, fatigue, fever,   CARDIOVASCULAR: Negative for chest pain, dizziness, palpitations  RESPIRATORY: Negative for recent cough and dyspnea.  GASTROINTESTINAL: Negative for abdominal pain, acid reflux symptoms, constipation, diarrhea, nausea and vomiting.      Objective:    PHYSICAL EXAM:   BP 110/74 (BP  Location: Left Arm, Patient Position: Sitting, Cuff Size: Normal)   Pulse 93   Temp (!) 97.4 F (36.3 C) (Temporal)   Ht 5' 2.5" (1.588 m)   Wt 151 lb 3.2 oz (68.6 kg)   SpO2 96%   BMI 27.21 kg/m    GEN: Well nourished, well developed, in no acute distress  Cardiac: RRR; no murmurs, rubs, or gallops,no edema -  Respiratory:  normal respiratory rate and pattern with no distress - normal breath sounds with no rales, rhonchi, wheezes or rubs Psych: euthymic mood, appropriate affect and demeanor     Assessment & Plan:    Primary hypertension -     Comprehensive metabolic panel -     Losartan Potassium; Take 1 tablet (50 mg total) by mouth daily.  Dispense: 90 tablet; Refill: 1     Follow-up: Return for as scheduled in August.  An After Visit Summary was printed and given to the patient.  Jettie Pagan [facility] [phone]

## 2022-08-28 ENCOUNTER — Other Ambulatory Visit: Payer: Self-pay | Admitting: Physician Assistant

## 2022-08-28 DIAGNOSIS — R899 Unspecified abnormal finding in specimens from other organs, systems and tissues: Secondary | ICD-10-CM

## 2022-08-28 LAB — COMPREHENSIVE METABOLIC PANEL
ALT: 28 IU/L (ref 0–32)
AST: 27 IU/L (ref 0–40)
Albumin/Globulin Ratio: 2.2 (ref 1.2–2.2)
Albumin: 4.9 g/dL — ABNORMAL HIGH (ref 3.8–4.8)
Alkaline Phosphatase: 95 IU/L (ref 44–121)
BUN/Creatinine Ratio: 14 (ref 12–28)
BUN: 16 mg/dL (ref 8–27)
Bilirubin Total: 0.4 mg/dL (ref 0.0–1.2)
CO2: 21 mmol/L (ref 20–29)
Calcium: 10.8 mg/dL — ABNORMAL HIGH (ref 8.7–10.3)
Chloride: 103 mmol/L (ref 96–106)
Creatinine, Ser: 1.11 mg/dL — ABNORMAL HIGH (ref 0.57–1.00)
Globulin, Total: 2.2 g/dL (ref 1.5–4.5)
Glucose: 112 mg/dL — ABNORMAL HIGH (ref 70–99)
Potassium: 4.3 mmol/L (ref 3.5–5.2)
Sodium: 140 mmol/L (ref 134–144)
Total Protein: 7.1 g/dL (ref 6.0–8.5)
eGFR: 51 mL/min/{1.73_m2} — ABNORMAL LOW (ref >59)

## 2022-09-10 ENCOUNTER — Other Ambulatory Visit: Payer: Medicare HMO

## 2022-09-10 DIAGNOSIS — R899 Unspecified abnormal finding in specimens from other organs, systems and tissues: Secondary | ICD-10-CM

## 2022-09-11 LAB — COMPREHENSIVE METABOLIC PANEL
ALT: 24 IU/L (ref 0–32)
AST: 23 IU/L (ref 0–40)
Albumin/Globulin Ratio: 2
Albumin: 4.5 g/dL (ref 3.8–4.8)
Alkaline Phosphatase: 88 IU/L (ref 44–121)
BUN/Creatinine Ratio: 26 (ref 12–28)
BUN: 17 mg/dL (ref 8–27)
Bilirubin Total: 0.3 mg/dL (ref 0.0–1.2)
CO2: 23 mmol/L (ref 20–29)
Calcium: 10.5 mg/dL — ABNORMAL HIGH (ref 8.7–10.3)
Chloride: 103 mmol/L (ref 96–106)
Creatinine, Ser: 0.66 mg/dL (ref 0.57–1.00)
Globulin, Total: 2.2 g/dL (ref 1.5–4.5)
Glucose: 96 mg/dL (ref 70–99)
Potassium: 4.4 mmol/L (ref 3.5–5.2)
Sodium: 139 mmol/L (ref 134–144)
Total Protein: 6.7 g/dL (ref 6.0–8.5)
eGFR: 90 mL/min/{1.73_m2} (ref >59)

## 2022-09-11 LAB — PARATHYROID HORMONE, INTACT (NO CA): PTH: 37 pg/mL (ref 15–65)

## 2022-09-18 DIAGNOSIS — H26491 Other secondary cataract, right eye: Secondary | ICD-10-CM | POA: Diagnosis not present

## 2022-09-30 ENCOUNTER — Other Ambulatory Visit: Payer: Self-pay | Admitting: Physician Assistant

## 2022-09-30 DIAGNOSIS — E782 Mixed hyperlipidemia: Secondary | ICD-10-CM

## 2022-10-01 DIAGNOSIS — K219 Gastro-esophageal reflux disease without esophagitis: Secondary | ICD-10-CM | POA: Diagnosis not present

## 2022-10-01 DIAGNOSIS — M818 Other osteoporosis without current pathological fracture: Secondary | ICD-10-CM | POA: Diagnosis not present

## 2022-10-01 DIAGNOSIS — N390 Urinary tract infection, site not specified: Secondary | ICD-10-CM | POA: Diagnosis not present

## 2022-10-01 DIAGNOSIS — R5382 Chronic fatigue, unspecified: Secondary | ICD-10-CM | POA: Diagnosis not present

## 2022-10-01 DIAGNOSIS — Z0100 Encounter for examination of eyes and vision without abnormal findings: Secondary | ICD-10-CM | POA: Diagnosis not present

## 2022-10-22 DIAGNOSIS — R300 Dysuria: Secondary | ICD-10-CM | POA: Diagnosis not present

## 2022-10-22 DIAGNOSIS — N3091 Cystitis, unspecified with hematuria: Secondary | ICD-10-CM | POA: Diagnosis not present

## 2022-11-07 ENCOUNTER — Ambulatory Visit: Payer: Medicare HMO | Admitting: Physician Assistant

## 2022-11-11 DIAGNOSIS — M5416 Radiculopathy, lumbar region: Secondary | ICD-10-CM | POA: Diagnosis not present

## 2022-11-11 DIAGNOSIS — Z981 Arthrodesis status: Secondary | ICD-10-CM | POA: Diagnosis not present

## 2022-11-11 DIAGNOSIS — M4316 Spondylolisthesis, lumbar region: Secondary | ICD-10-CM | POA: Diagnosis not present

## 2022-11-15 ENCOUNTER — Encounter: Payer: Self-pay | Admitting: Physician Assistant

## 2022-11-15 ENCOUNTER — Ambulatory Visit (INDEPENDENT_AMBULATORY_CARE_PROVIDER_SITE_OTHER): Payer: Medicare HMO | Admitting: Physician Assistant

## 2022-11-15 VITALS — BP 112/80 | HR 78 | Temp 97.3°F | Ht 62.5 in | Wt 148.6 lb

## 2022-11-15 DIAGNOSIS — N3000 Acute cystitis without hematuria: Secondary | ICD-10-CM | POA: Diagnosis not present

## 2022-11-15 DIAGNOSIS — E782 Mixed hyperlipidemia: Secondary | ICD-10-CM

## 2022-11-15 DIAGNOSIS — F5101 Primary insomnia: Secondary | ICD-10-CM | POA: Diagnosis not present

## 2022-11-15 DIAGNOSIS — M810 Age-related osteoporosis without current pathological fracture: Secondary | ICD-10-CM

## 2022-11-15 DIAGNOSIS — M4807 Spinal stenosis, lumbosacral region: Secondary | ICD-10-CM

## 2022-11-15 DIAGNOSIS — K219 Gastro-esophageal reflux disease without esophagitis: Secondary | ICD-10-CM

## 2022-11-15 DIAGNOSIS — I10 Essential (primary) hypertension: Secondary | ICD-10-CM | POA: Diagnosis not present

## 2022-11-15 LAB — POCT URINALYSIS DIP (CLINITEK)
Bilirubin, UA: NEGATIVE
Blood, UA: NEGATIVE
Glucose, UA: NEGATIVE mg/dL
Ketones, POC UA: NEGATIVE mg/dL
Nitrite, UA: NEGATIVE
POC PROTEIN,UA: NEGATIVE
Spec Grav, UA: 1.015 (ref 1.010–1.025)
Urobilinogen, UA: 0.2 E.U./dL
pH, UA: 6 (ref 5.0–8.0)

## 2022-11-15 MED ORDER — TRAZODONE HCL 50 MG PO TABS
ORAL_TABLET | ORAL | Status: AC
Start: 2022-11-15 — End: ?

## 2022-11-15 NOTE — Progress Notes (Signed)
Acute Office Visit  Subjective:    Patient ID: Kristen Mccarthy, female    DOB: 10-10-1945, 77 y.o.   MRN: [id number]  Chief Complaint  Patient presents with   Medical Management of Chronic Issues    HPI: Patient is in today for follow up hypertension - pt states that her bp has been running low at home and she stopped losartan about 2 weeks ago - denies chest pain/sob/edema Bp has been normal now and thinks all the stress she was dealing with her husband was the cause of her bp elevation  Pt states that she has been having trouble sleeping - she has taken ativan which did not help and has completely stopped that medication.  She had tried her husbands trazaodone 50mg  but she states that was too strong.  Advise to try 1/2 tablet at bedtime  Pt with hyperlipidemia - is on Repatha  Pt with GERD - stable on prilosec  Pt with chronic low back pain - takes gabapentin and follows with spine specialist  Pt had uti recently and seen at urgent care - is having no symptoms today but due to recheck ua  Current Outpatient Medications:    cetirizine (ZYRTEC) 10 MG tablet, Take 10 mg by mouth daily., Disp: , Rfl:    D-MANNOSE PO, Take by mouth., Disp: , Rfl:    Evolocumab (REPATHA) 140 MG/ML SOSY, INJECT 140 MG INTO THE SKIN EVERY 14 (FOURTEEN) DAYS., Disp: 6 mL, Rfl: 1   gabapentin (NEURONTIN) 300 MG capsule, Take 300 mg by mouth 2 (two) times daily., Disp: , Rfl:    HM MAGNESIUM CITRATE PO, Take 400 mg by mouth in the morning and at bedtime., Disp: , Rfl:    Omega-3 1000 MG CAPS, Take 1 capsule by mouth every morning., Disp: , Rfl:    omeprazole (PRILOSEC) 40 MG capsule, Take 1 capsule (40 mg total) by mouth daily., Disp: 90 capsule, Rfl: 4   traZODone (DESYREL) 50 MG tablet, 1/2 po qhs, Disp: , Rfl:   Allergies  Allergen Reactions   Azithromycin Rash and Dermatitis   Clindamycin/Lincomycin Rash   Penicillins Rash   Sulfa Antibiotics Other (See Comments)    Stomach ache Stomach  ache Other reaction(s): Other (See Comments) Stomach ache Other reaction(s): Abdominal Pain, Other Stomach ache Stomach ache Stomach ache Stomach ache Stomach ache Stomach ache   CONSTITUTIONAL: Negative for chills, fatigue, fever, unintentional weight gain and unintentional weight loss.  E/N/T: Negative for ear pain, nasal congestion and sore throat.  CARDIOVASCULAR: Negative for chest pain, dizziness, palpitations and pedal edema.  RESPIRATORY: Negative for recent cough and dyspnea.  GASTROINTESTINAL: Negative for abdominal pain, acid reflux symptoms, constipation, diarrhea, nausea and vomiting.  MSK:see HPI INTEGUMENTARY: Negative for rash.  NEUROLOGICAL: Negative for dizziness and headaches.  PSYCHIATRIC: see HPI        Objective:    PHYSICAL EXAM:   VS: BP 112/80 (BP Location: Left Arm, Patient Position: Sitting, Cuff Size: Large)   Pulse 78   Temp (!) 97.3 F (36.3 C) (Temporal)   Ht 5' 2.5" (1.588 m)   Wt 148 lb 9.6 oz (67.4 kg)   SpO2 98%   BMI 26.75 kg/m   GEN: Well nourished, well developed, in no acute distress   Cardiac: RRR; no murmurs, rubs, or gallops,no edema -  Respiratory:  normal respiratory rate and pattern with no distress - normal breath sounds with no rales, rhonchi, wheezes or rubs  MS: no deformity or atrophy  Skin:  warm and dry, no rash   Psych: euthymic mood, appropriate affect and demeanor  Assessment & Plan:    Acute cystitis without hematuria -     POCT URINALYSIS DIP (CLINITEK) -     Urine Culture Will treat if culture positive Primary hypertension -     CBC with Differential/Platelet -     Comprehensive metabolic panel -     TSH Stay off medication at this time Osteoporosis without current pathological fracture, unspecified osteoporosis type -     VITAMIN D 25 Hydroxy (Vit-D Deficiency, Fractures)  Mixed hyperlipidemia -     Lipid panel Continue repatha Primary insomnia -     traZODone HCl; 1/2 po qhs  Gastroesophageal  reflux disease without esophagitis Continue omeprazole Spinal stenosis of lumbosacral region Continue gabapentin    Follow-up: Return in about 6 months (around 05/18/2023) for chronic fasting follow-up.  An After Visit Summary was printed and given to the patient.  Jettie Pagan [facility] [phone]

## 2022-11-16 LAB — CBC WITH DIFFERENTIAL/PLATELET
Basophils Absolute: 0 10*3/uL (ref 0.0–0.2)
Basos: 1 %
EOS (ABSOLUTE): 0.1 10*3/uL (ref 0.0–0.4)
Eos: 2 %
Hematocrit: 43 % (ref 34.0–46.6)
Hemoglobin: 15 g/dL (ref 11.1–15.9)
Immature Grans (Abs): 0 10*3/uL (ref 0.0–0.1)
Immature Granulocytes: 0 %
Lymphocytes Absolute: 2.4 10*3/uL (ref 0.7–3.1)
Lymphs: 39 %
MCH: 31.6 pg (ref 26.6–33.0)
MCHC: 34.9 g/dL (ref 31.5–35.7)
MCV: 91 fL (ref 79–97)
Monocytes Absolute: 0.6 10*3/uL (ref 0.1–0.9)
Monocytes: 10 %
Neutrophils Absolute: 3 10*3/uL (ref 1.4–7.0)
Neutrophils: 48 %
Platelets: 277 10*3/uL (ref 150–450)
RBC: 4.75 x10E6/uL (ref 3.77–5.28)
RDW: 12.3 % (ref 11.7–15.4)
WBC: 6.2 10*3/uL (ref 3.4–10.8)

## 2022-11-16 LAB — TSH: TSH: 1.63 u[IU]/mL (ref 0.450–4.500)

## 2022-11-16 LAB — URINE CULTURE

## 2022-11-16 LAB — COMPREHENSIVE METABOLIC PANEL
ALT: 26 IU/L (ref 0–32)
AST: 26 IU/L (ref 0–40)
Albumin: 4.7 g/dL (ref 3.8–4.8)
Alkaline Phosphatase: 69 IU/L (ref 44–121)
BUN/Creatinine Ratio: 24 (ref 12–28)
BUN: 18 mg/dL (ref 8–27)
Bilirubin Total: 0.4 mg/dL (ref 0.0–1.2)
CO2: 22 mmol/L (ref 20–29)
Calcium: 10.7 mg/dL — ABNORMAL HIGH (ref 8.7–10.3)
Chloride: 103 mmol/L (ref 96–106)
Creatinine, Ser: 0.75 mg/dL (ref 0.57–1.00)
Globulin, Total: 2.3 g/dL (ref 1.5–4.5)
Glucose: 103 mg/dL — ABNORMAL HIGH (ref 70–99)
Potassium: 4.5 mmol/L (ref 3.5–5.2)
Sodium: 141 mmol/L (ref 134–144)
Total Protein: 7 g/dL (ref 6.0–8.5)
eGFR: 82 mL/min/{1.73_m2} (ref >59)

## 2022-11-16 LAB — LIPID PANEL
Chol/HDL Ratio: 2.5 ratio (ref 0.0–4.4)
Cholesterol, Total: 106 mg/dL (ref 100–199)
HDL: 42 mg/dL (ref >39)
LDL Chol Calc (NIH): 34 mg/dL (ref 0–99)
Triglycerides: 182 mg/dL — ABNORMAL HIGH (ref 0–149)
VLDL Cholesterol Cal: 30 mg/dL (ref 5–40)

## 2022-11-16 LAB — VITAMIN D 25 HYDROXY (VIT D DEFICIENCY, FRACTURES): Vit D, 25-Hydroxy: 104 ng/mL — ABNORMAL HIGH (ref 30.0–100.0)

## 2022-11-18 ENCOUNTER — Other Ambulatory Visit: Payer: Self-pay | Admitting: Physician Assistant

## 2022-11-26 ENCOUNTER — Ambulatory Visit: Payer: Medicare HMO

## 2022-11-26 VITALS — Ht 62.25 in | Wt 143.4 lb

## 2022-11-26 DIAGNOSIS — Z0000 Encounter for general adult medical examination without abnormal findings: Secondary | ICD-10-CM

## 2022-11-26 NOTE — Patient Instructions (Addendum)
Kristen Mccarthy , Thank you for taking time to come for your Medicare Wellness Visit. I appreciate your ongoing commitment to your health goals. Please review the following plan we discussed and let me know if I can assist you in the future.   Referrals/Orders/Follow-Ups/Clinician Recommendations:   This is a list of the screening recommended for you and due dates:  Health Maintenance  Topic Date Due   DTaP/Tdap/Td vaccine (1 - Tdap) Never done   Flu Shot  10/31/2022   Mammogram  06/17/2023   Medicare Annual Wellness Visit  11/26/2023   DEXA scan (bone density measurement)  06/16/2024   Hepatitis C Screening  Completed   Zoster (Shingles) Vaccine  Completed   HPV Vaccine  Aged Out   Pneumonia Vaccine  Discontinued   COVID-19 Vaccine  Discontinued   Cologuard (Stool DNA test)  Discontinued    Advanced directives: (Copy Requested) Please bring a copy of your health care power of attorney and living will to the office to be added to your chart at your convenience.  Next Medicare Annual Wellness Visit scheduled for next year: Yes

## 2022-11-26 NOTE — Progress Notes (Signed)
Subjective:   Kristen Mccarthy is a 77 y.o. female who presents for Medicare Annual (Subsequent) preventive examination.  Visit Complete: Virtual  I connected with  Genni Dianne Yazzie on 11/26/22 by a audio enabled telemedicine application and verified that I am speaking with the correct person using two identifiers.  Patient Location: Home  Provider Location: Home Office  I discussed the limitations of evaluation and management by telemedicine. The patient expressed understanding and agreed to proceed.   Review of Systems    Vital Signs: Unable to obtain new vitals due to this being a telehealth visit.  Cardiac Risk Factors include: advanced age (>8men, >75 women);hypertension     Objective:    Today's Vitals   11/26/22 1514 11/26/22 1515  Weight: 143 lb 6.4 oz (65 kg)   Height: 5' 2.25" (1.581 m)   PainSc:  0-No pain   Body mass index is 26.02 kg/m.     11/26/2022    3:24 PM 12/26/2020    2:23 PM 12/16/2019    9:31 AM 10/01/2017   12:10 PM 09/16/2017   11:50 AM  Advanced Directives  Does Patient Have a Medical Advance Directive? Yes Yes Yes Yes Yes  Type of Estate agent of [place];Living will Living will;Healthcare Power of [employer] Power of [place];Living will Living will;Healthcare Power of Attorney Living will;Healthcare Power of Attorney  Does patient want to make changes to medical advance directive?  No - Patient declined  No - Patient declined No - Patient declined  Copy of Healthcare Power of Attorney in Chart? No - copy requested No - copy requested  No - copy requested No - copy requested    Current Medications (verified) Outpatient Encounter Medications as of 11/26/2022  Medication Sig   cetirizine (ZYRTEC) 10 MG tablet Take 10 mg by mouth daily.   D-MANNOSE PO Take by mouth.   Evolocumab (REPATHA) 140 MG/ML SOSY INJECT 140 MG INTO THE SKIN EVERY 14 (FOURTEEN) DAYS.   gabapentin (NEURONTIN) 300 MG capsule Take 300 mg  by mouth 2 (two) times daily.   HM MAGNESIUM CITRATE PO Take 400 mg by mouth in the morning and at bedtime.   Omega-3 1000 MG CAPS Take 1 capsule by mouth every morning.   omeprazole (PRILOSEC) 40 MG capsule Take 1 capsule (40 mg total) by mouth daily.   traZODone (DESYREL) 50 MG tablet 1/2 po qhs   No facility-administered encounter medications on file as of 11/26/2022.    Allergies (verified) Azithromycin, Clindamycin/lincomycin, Penicillins, and Sulfa antibiotics   History: Past Medical History:  Diagnosis Date   Age-related osteoporosis without current pathological fracture 07/23/2019   Breast mass, left    Calculus of gallbladder with chronic cholecystitis without obstruction 09/04/2015   Complication of anesthesia    Diffuse cystic mastopathy of right breast 07/23/2019   GERD (gastroesophageal reflux disease)    Hyperlipidemia    IBS (irritable bowel syndrome)    Mixed hyperlipidemia 07/23/2019   Osteoporosis    Other acute recurrent sinusitis 07/23/2019   PONV (postoperative nausea and vomiting)    Past Surgical History:  Procedure Laterality Date   ABDOMINAL HYSTERECTOMY     BREAST LUMPECTOMY WITH RADIOACTIVE SEED LOCALIZATION Left 10/01/2017   Procedure: BREAST LUMPECTOMY WITH RADIOACTIVE SEED LOCALIZATION;  Surgeon: Abigail Miyamoto, MD;  Location: [facility];  Service: General;  Laterality: Left;   CHOLECYSTECTOMY     FOOT SURGERY Left    INCONTINENCE SURGERY     Family History  Problem  Relation Age of Onset   Heart disease Father    Social History   Socioeconomic History   Marital status: Married    Spouse name: Management consultant   Number of children: 2   Years of education: Not on file   Highest education level: Not on file  Occupational History   Not on file  Tobacco Use   Smoking status: Never   Smokeless tobacco: Never  Vaping Use   Vaping status: Never Used  Substance and Sexual Activity   Alcohol use: Never   Drug use: Never   Sexual activity:  Not Currently    Birth control/protection: Surgical  Other Topics Concern   Not on file  Social History Narrative   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, lives in apartment with husband in lower level of son's home   Social Determinants of Health   Financial Resource Strain: Low Risk  (11/26/2022)   Overall Financial Resource Strain (CARDIA)    Difficulty of Paying Living Expenses: Not hard at all  Food Insecurity: No Food Insecurity (11/26/2022)   Hunger Vital Sign    Worried About Running Out of Food in the Last Year: Never true    Ran Out of Food in the Last Year: Never true  Transportation Needs: No Transportation Needs (11/26/2022)   PRAPARE - Administrator, Civil Service (Medical): No    Lack of Transportation (Non-Medical): No  Physical Activity: Inactive (11/26/2022)   Exercise Vital Sign    Days of Exercise per Week: 0 days    Minutes of Exercise per Session: 0 min  Stress: No Stress Concern Present (11/26/2022)   [employer] of Occupational Health - Occupational Stress Questionnaire    Feeling of Stress : Not at all  Social Connections: Socially Integrated (11/26/2022)   Social Connection and Isolation Panel [NHANES]    Frequency of Communication with Friends and Family: More than three times a week    Frequency of Social Gatherings with Friends and Family: More than three times a week    Attends Religious Services: More than 4 times per year    Active Member of [employer] or Organizations: Yes    Attends Engineer, structural: More than 4 times per year    Marital Status: Married    Tobacco Counseling Counseling given: Not Answered   Clinical Intake:  Pre-visit preparation completed: Yes  Pain : No/denies pain Pain Score: 0-No pain     BMI - recorded: 26.02 Nutritional Status: BMI 25 -29 Overweight Nutritional Risks: None Diabetes: No  How often do you need to have someone help you when you read instructions, pamphlets, or other written  materials from your doctor or pharmacy?: 1 - Never  Interpreter Needed?: No  Information entered by :: Theresa Mulligan LPN   Activities of Daily Living    11/26/2022    3:21 PM 01/11/2022   10:21 AM  In your present state of health, do you have any difficulty performing the following activities:  Hearing? 0 0  Vision? 0 0  Difficulty concentrating or making decisions? 0 0  Walking or climbing stairs? 0 0  Dressing or bathing? 0 0  Doing errands, shopping? 0 0  Preparing Food and eating ? N N  Using the Toilet? N N  In the past six months, have you accidently leaked urine? Y N  Comment Wears pad. Followed by Urologist   Do you have problems with loss of bowel control? N N  Managing your Medications? N  N  Managing your Finances? N N  Housekeeping or managing your Housekeeping? N N    Patient Care Team: Marianne Sofia, Cordelia Poche as PCP - General (Physician Assistant) Yvette Rack, OD (Optometry) Debroah Baller, MD as Consulting Physician (Urology)  Indicate any recent Medical Services you may have received from other than [facility] providers in the past year (date may be approximate).     Assessment:   This is a routine wellness examination for Kristen Mccarthy.  Hearing/Vision screen Hearing Screening - Comments:: Denies hearing difficulties   Vision Screening - Comments:: Wears reading glasses - up to date with routine eye exams with  Dr Trisha Mangle  Dietary issues and exercise activities discussed:     Goals Addressed               This Visit's Progress     Stay Healthy (pt-stated)        Take care of my husband.       Depression Screen    11/26/2022    3:20 PM 11/15/2022   10:03 AM 08/07/2022   10:46 AM 02/15/2022   10:45 AM 01/11/2022   10:21 AM 04/17/2021    9:21 AM 12/26/2020    2:18 PM  PHQ 2/9 Scores  PHQ - 2 Score 0 0 1 0 0 0 0  PHQ- 9 Score 0 0 4        Fall Risk    11/26/2022    3:22 PM 11/15/2022   10:03 AM 08/07/2022   10:46 AM 05/07/2022    9:08 AM 01/11/2022    10:16 AM  Fall Risk   Falls in the past year? 0 0 1 1 0  Number falls in past yr: 0 0 0 0 0  Injury with Fall? 0 0 0 0 0  Risk for fall due to : No Fall Risks No Fall Risks History of fall(s) No Fall Risks No Fall Risks  Follow up Falls prevention discussed Falls evaluation completed Falls evaluation completed  Falls evaluation completed;Education provided    MEDICARE RISK AT HOME: Medicare Risk at Home Any stairs in or around the home?: No If so, are there any without handrails?: No Home free of loose throw rugs in walkways, pet beds, electrical cords, etc?: Yes Adequate lighting in your home to reduce risk of falls?: Yes Life alert?: No Use of a cane, walker or w/c?: No Grab bars in the bathroom?: Yes Shower chair or bench in shower?: Yes Elevated toilet seat or a handicapped toilet?: Yes  TIMED UP AND GO:  Was the test performed?  No    Cognitive Function:    12/16/2019    9:34 AM  MMSE - Mini Mental State Exam  Orientation to time 5  Orientation to Place 5  Registration 3  Attention/ Calculation 5  Recall 3  Language- name 2 objects 2  Language- repeat 1  Language- follow 3 step command 3  Language- read & follow direction 1  Write a sentence 1  Copy design 1  Total score 30        11/26/2022    3:24 PM 01/11/2022   10:22 AM 12/26/2020    2:25 PM  6CIT Screen  What Year? 0 points 0 points 0 points  What month? 0 points 0 points 0 points  What time? 0 points 0 points 0 points  Count back from 20 0 points 0 points 0 points  Months in reverse 0 points 0 points 0 points  Repeat phrase 0 points  0 points 0 points  Total Score 0 points 0 points 0 points    Immunizations Immunization History  Administered Date(s) Administered   Zoster Recombinant(Shingrix) 05/15/2022, 07/18/2022    TDAP status: Due, Education has been provided regarding the importance of this vaccine. Advised may receive this vaccine at local pharmacy or Health Dept. Aware to provide a copy  of the vaccination record if obtained from local pharmacy or Health Dept. Verbalized acceptance and understanding.  Flu Vaccine status: Due, Education has been provided regarding the importance of this vaccine. Advised may receive this vaccine at local pharmacy or Health Dept. Aware to provide a copy of the vaccination record if obtained from local pharmacy or Health Dept. Verbalized acceptance and understanding.      Qualifies for Shingles Vaccine? Yes   Zostavax completed Yes   Shingrix Completed?: Yes  Screening Tests Health Maintenance  Topic Date Due   DTaP/Tdap/Td (1 - Tdap) Never done   INFLUENZA VACCINE  10/31/2022   MAMMOGRAM  06/17/2023   Medicare Annual Wellness (AWV)  11/26/2023   DEXA SCAN  06/16/2024   Hepatitis C Screening  Completed   Zoster Vaccines- Shingrix  Completed   HPV VACCINES  Aged Out   Pneumonia Vaccine 28+ Years old  Discontinued   COVID-19 Vaccine  Discontinued   Fecal DNA (Cologuard)  Discontinued    Health Maintenance  Health Maintenance Due  Topic Date Due   DTaP/Tdap/Td (1 - Tdap) Never done   INFLUENZA VACCINE  10/31/2022      Mammogram status: Completed 06/17/22. Repeat every year  Bone Density status: Completed 06/17/22. Results reflect: Bone density results: OSTEOPOROSIS. Repeat every   years.  Lung Cancer Screening: (Low Dose CT Chest recommended if Age 32-80 years, 20 pack-year currently smoking OR have quit w/in 15years.) does not qualify.    Additional Screening:  Hepatitis C Screening: does qualify; Completed 10/24/21  Vision Screening: Recommended annual ophthalmology exams for early detection of glaucoma and other disorders of the eye. Is the patient up to date with their annual eye exam?  Yes  Who is the provider or what is the name of the office in which the patient attends annual eye exams? Dr Trisha Mangle If pt is not established with a provider, would they like to be referred to a provider to establish care? No .   Dental  Screening: Recommended annual dental exams for proper oral hygiene   Community Resource Referral / Chronic Care Management:  CRR required this visit?  No   CCM required this visit?  No     Plan:     I have personally reviewed and noted the following in the patient's chart:   Medical and social history Use of alcohol, tobacco or illicit drugs  Current medications and supplements including opioid prescriptions. Patient is not currently taking opioid prescriptions. Functional ability and status Nutritional status Physical activity Advanced directives List of other physicians Hospitalizations, surgeries, and ER visits in previous 12 months Vitals Screenings to include cognitive, depression, and falls Referrals and appointments  In addition, I have reviewed and discussed with patient certain preventive protocols, quality metrics, and best practice recommendations. A written personalized care plan for preventive services as well as general preventive health recommendations were provided to patient.     Tillie Rung, LPN   07/01/270   After Visit Summary: (MyChart) Due to this being a telephonic visit, the after visit summary with patients personalized plan was offered to patient via MyChart   Nurse Notes:  None

## 2022-12-06 DIAGNOSIS — N3021 Other chronic cystitis with hematuria: Secondary | ICD-10-CM | POA: Diagnosis not present

## 2022-12-18 DIAGNOSIS — N3946 Mixed incontinence: Secondary | ICD-10-CM | POA: Diagnosis not present

## 2022-12-18 DIAGNOSIS — N3021 Other chronic cystitis with hematuria: Secondary | ICD-10-CM | POA: Diagnosis not present

## 2022-12-19 ENCOUNTER — Other Ambulatory Visit: Payer: Medicare HMO

## 2022-12-19 ENCOUNTER — Telehealth: Payer: Self-pay

## 2022-12-19 NOTE — Telephone Encounter (Signed)
Patient came in office and stated she will need to do another medication other than Repatha, stated her insurance said she is in the donut hole and she is not able to afford the medication. Please advise

## 2022-12-19 NOTE — Telephone Encounter (Signed)
Pt is not able to tolerate statins -- other choice is praluent but unsure if that will be any cheaper Selena Batten - can you see if pt available for any assistance with repatha?

## 2022-12-21 LAB — PTH, INTACT AND CALCIUM
Calcium: 9.1 mg/dL (ref 8.7–10.3)
PTH: 13 pg/mL — ABNORMAL LOW (ref 15–65)

## 2022-12-24 ENCOUNTER — Other Ambulatory Visit: Payer: Self-pay

## 2022-12-24 DIAGNOSIS — Z5986 Financial insecurity: Secondary | ICD-10-CM

## 2022-12-26 ENCOUNTER — Telehealth: Payer: Self-pay

## 2022-12-26 NOTE — Progress Notes (Signed)
Care Guide Note  12/26/2022 Name: Kristen Mccarthy MRN: [id number] DOB: 07/01/1945  Referred by: Marianne Sofia, PA-C Reason for referral : Care Coordination (Outreach to schedule with Pharm d )   Kristen Mccarthy is a 77 y.o. year old female who is a primary care patient of Marianne Sofia, Cordelia Poche. Kristen Mccarthy was referred to the pharmacist for assistance related to HLD.    Successful contact was made with the patient to discuss pharmacy services including being ready for the pharmacist to call at least 5 minutes before the scheduled appointment time, to have medication bottles and any blood sugar or blood pressure readings ready for review. The patient agreed to meet with the pharmacist via with the pharmacist via telephone visit on (date/time).  12/31/2022  Penne Lash, RMA Care Guide [facility]  [place], [place] [address] Direct Dial: [phone] [email or web address]

## 2022-12-31 ENCOUNTER — Other Ambulatory Visit: Payer: Medicare HMO | Admitting: Pharmacist

## 2022-12-31 NOTE — Progress Notes (Signed)
12/31/2022 Name: Kristen Mccarthy MRN: [id number] DOB: 09/10/1945  Chief Complaint  Patient presents with   Medication Assistance    Kristen Mccarthy is a 77 y.o. year old female who presented for a telephone visit.   They were referred to the pharmacist by their PCP for assistance in managing medication access. - repatha   Subjective:  Care Team: Primary Care Provider: Marianne Sofia, PA-C   Medication Access/Adherence  Current Pharmacy:  [facility] - [place], [place] - [address] [facility] AT Portal to Registered [facility] Sites [address] [place] [address] Phone: [phone] Fax: [phone]  [id number], [place] - [address] AT [address] [place] [address] Phone: [phone] Fax: [phone]   Patient reports affordability concerns with their medications: Yes - repatha $57month but now donut hole, $414 a month    Hyperlipidemia/ASCVD Risk Reduction  Current lipid lowering medications: repatha 140mg  SQ every 14 days Medications tried in the past: statins, she is unsure which  Current medication access support: none at present   Objective:   Lab Results  Component Value Date   CREATININE 0.75 11/15/2022   BUN 18 11/15/2022   NA 141 11/15/2022   K 4.5 11/15/2022   CL 103 11/15/2022   CO2 22 11/15/2022    Lab Results  Component Value Date   CHOL 106 11/15/2022   HDL 42 11/15/2022   LDLCALC 34 11/15/2022   TRIG 182 (H) 11/15/2022   CHOLHDL 2.5 11/15/2022    Medications Reviewed Today     Reviewed by Gabriel Carina, RPH (Pharmacist) on 12/31/22 at 1633  Med List Status: <None>   Medication Order Taking? Sig Documenting Provider Last Dose Status Informant  cetirizine (ZYRTEC) 10 MG tablet [id number] Yes Take 10 mg by mouth daily. [provider] Taking Active   D-MANNOSE PO [id number] Yes Take by mouth. [provider] Taking Active   Evolocumab  (REPATHA) 140 MG/ML SOSY [id number] Yes INJECT 140 MG INTO THE SKIN EVERY 14 (FOURTEEN) DAYS. Marianne Sofia, PA-C Taking Active   HM MAGNESIUM CITRATE PO [id number] Yes Take 400 mg by mouth in the morning and at bedtime. [provider] Taking Active   Omega-3 1000 MG CAPS [id number] Yes Take 1 capsule by mouth in the morning and at bedtime. [provider] Taking Active   omeprazole (PRILOSEC) 40 MG capsule [id number] Yes Take 1 capsule (40 mg total) by mouth daily. Marianne Sofia, PA-C Taking Active   PREMARIN vaginal cream [id number] Yes Place 1 applicator vaginally 3 (three) times a week. [provider] Taking Active   trimethoprim (TRIMPEX) 100 MG tablet [id number] Yes Take 100 mg by mouth daily. [provider] Taking Active              Assessment/Plan:   Hyperlipidemia/ASCVD Risk Reduction: - Currently controlled. Provided education regarding risk/benefit of cholesterol medication, value of prevention of CV events - Reviewed long term complications of uncontrolled cholesterol - Reviewed lifestyle recommendations including continued efforts for achieving healthy weight - Recommend to continue current regimen  - Meets financial criteria for repatha patient assistance program through [employer]. Will collaborate with provider, CPhT, and patient to pursue assistance.    Follow Up Plan: 2-3 weeks to assess PAP progress  Lynnda Shields, PharmD, BCPS Clinical Pharmacist [facility]

## 2023-01-02 ENCOUNTER — Telehealth: Payer: Self-pay

## 2023-01-02 NOTE — Telephone Encounter (Signed)
PAP: PAP application for REPATHA, ([employer]) has been mailed to pt's home address on file. Will fax provider portion of application to provider's office when pt's portion is received.  PLEASE BE ADVISED

## 2023-01-02 NOTE — Telephone Encounter (Signed)
-----   Message from Gabriel Carina sent at 12/31/2022  4:41 PM EDT ----- Hi,  Can you please initiate application for repatha via [employer] patient assistance?  Thank you, Thurston Hole

## 2023-01-14 ENCOUNTER — Other Ambulatory Visit: Payer: Medicare HMO | Admitting: Pharmacist

## 2023-01-14 DIAGNOSIS — N3000 Acute cystitis without hematuria: Secondary | ICD-10-CM | POA: Diagnosis not present

## 2023-01-14 NOTE — Progress Notes (Signed)
01/15/2023 Name: Kristen Mccarthy MRN: [id number] DOB: October 04, 1945  Chief Complaint  Patient presents with   Medication Assistance    Kristen Mccarthy is a 77 y.o. year old female who presented for a telephone visit.   They were referred to the pharmacist by their PCP for assistance in managing medication access. - repatha   Subjective:  Care Team: Primary Care Provider: Marianne Sofia, PA-C   Medication Access/Adherence  Current Pharmacy:  [facility] - [place], [place] - [address] [facility] AT Portal to Registered [facility] Sites [address] [place] [address] Phone: [phone] Fax: [phone]  [id number], [place] - [address] AT [address] [place] [address] Phone: [phone] Fax: [phone]   Patient reports affordability concerns with their medications: Yes - repatha $25month but now donut hole, $414 a month    Hyperlipidemia/ASCVD Risk Reduction  Current lipid lowering medications: repatha 140mg  SQ every 14 days Medications tried in the past: statins, she is unsure which  Current medication access support: none at present   Objective:   Lab Results  Component Value Date   CREATININE 0.75 11/15/2022   BUN 18 11/15/2022   NA 141 11/15/2022   K 4.5 11/15/2022   CL 103 11/15/2022   CO2 22 11/15/2022    Lab Results  Component Value Date   CHOL 106 11/15/2022   HDL 42 11/15/2022   LDLCALC 34 11/15/2022   TRIG 182 (H) 11/15/2022   CHOLHDL 2.5 11/15/2022    Medications Reviewed Today     Reviewed by Gabriel Carina, RPH (Pharmacist) on 01/15/23 at 1521  Med List Status: <None>   Medication Order Taking? Sig Documenting Provider Last Dose Status Informant  cetirizine (ZYRTEC) 10 MG tablet [id number] No Take 10 mg by mouth daily. [provider] Taking Active   D-MANNOSE PO [id number] No Take by mouth. [provider] Taking Active   Evolocumab  (REPATHA) 140 MG/ML SOSY [id number] No INJECT 140 MG INTO THE SKIN EVERY 14 (FOURTEEN) DAYS. Marianne Sofia, PA-C Taking Active   HM MAGNESIUM CITRATE PO [id number] No Take 400 mg by mouth in the morning and at bedtime. [provider] Taking Active   Omega-3 1000 MG CAPS [id number] No Take 1 capsule by mouth in the morning and at bedtime. [provider] Taking Active   omeprazole (PRILOSEC) 40 MG capsule [id number] No Take 1 capsule (40 mg total) by mouth daily. Marianne Sofia, PA-C Taking Active   PREMARIN vaginal cream [id number] No Place 1 applicator vaginally 3 (three) times a week. [provider] Taking Active   trimethoprim (TRIMPEX) 100 MG tablet [id number] No Take 100 mg by mouth daily. [provider] Taking Active              Assessment/Plan:   Hyperlipidemia/ASCVD Risk Reduction: - Currently controlled - Recommend to continue current regimen  - Meets financial criteria for repatha patient assistance program through [employer]. Will collaborate with rx med assistance team for status update   Follow Up Plan: 2 weeks to assess PAP progress  Lynnda Shields, PharmD, BCPS Clinical Pharmacist [facility]

## 2023-01-17 DIAGNOSIS — N3021 Other chronic cystitis with hematuria: Secondary | ICD-10-CM | POA: Diagnosis not present

## 2023-01-17 DIAGNOSIS — R102 Pelvic and perineal pain: Secondary | ICD-10-CM | POA: Diagnosis not present

## 2023-01-17 DIAGNOSIS — R350 Frequency of micturition: Secondary | ICD-10-CM | POA: Diagnosis not present

## 2023-01-21 NOTE — Telephone Encounter (Signed)
FAXED PROVIDER PAGES TO PROVIDER OFF OF [facility]ARA [facility] FOR REPATHA ([employer]).      PLEASE BE ADVISED

## 2023-01-23 NOTE — Telephone Encounter (Signed)
PAP: Application for REPATHA has been submitted to PAP Companies: [employer], via fax  PLEASE BE ADVISED

## 2023-01-29 ENCOUNTER — Other Ambulatory Visit: Payer: Medicare HMO | Admitting: Pharmacist

## 2023-01-29 NOTE — Progress Notes (Signed)
01/29/2023 Name: Kristen Mccarthy MRN: [id number] DOB: May 03, 1945  Chief Complaint  Patient presents with   Medication Assistance    Kristen Mccarthy is a 77 y.o. year old female who presented for a telephone visit.   They were referred to the pharmacist by their PCP for assistance in managing medication access. - repatha. Patient reports she received a call from [employer], that things are mostly approved but they are needing a prescription sent by PCP.   Subjective:  Care Team: Primary Care Provider: Marianne Sofia, PA-C   Medication Access/Adherence  Current Pharmacy:  [facility] - [place], [place] - [facility] AT Portal to Registered [facility] Sites [address] [place] [address] Phone: [phone] Fax: [phone]  [id number], [place] - [address] AT [address] [place] [address] Phone: [phone] Fax: [phone]   Patient reports affordability concerns with their medications: Yes - repatha $69month but now donut hole, $414 a month    Hyperlipidemia/ASCVD Risk Reduction  Current lipid lowering medications: repatha 140mg  SQ every 14 days Medications tried in the past: statins, she is unsure which  Current medication access support: none at present   Objective:   Lab Results  Component Value Date   CREATININE 0.75 11/15/2022   BUN 18 11/15/2022   NA 141 11/15/2022   K 4.5 11/15/2022   CL 103 11/15/2022   CO2 22 11/15/2022    Lab Results  Component Value Date   CHOL 106 11/15/2022   HDL 42 11/15/2022   LDLCALC 34 11/15/2022   TRIG 182 (H) 11/15/2022   CHOLHDL 2.5 11/15/2022    Medications Reviewed Today     Reviewed by Gabriel Carina, RPH (Pharmacist) on 01/29/23 at 1606  Med List Status: <None>   Medication Order Taking? Sig Documenting Provider Last Dose Status Informant  cetirizine (ZYRTEC) 10 MG tablet [id number] No Take 10 mg by mouth  daily. [provider] Taking Active   D-MANNOSE PO [id number] No Take by mouth. [provider] Taking Active   Evolocumab (REPATHA) 140 MG/ML SOSY [id number] No INJECT 140 MG INTO THE SKIN EVERY 14 (FOURTEEN) DAYS. Marianne Sofia, PA-C Taking Active   HM MAGNESIUM CITRATE PO [id number] No Take 400 mg by mouth in the morning and at bedtime. [provider] Taking Active   Omega-3 1000 MG CAPS [id number] No Take 1 capsule by mouth in the morning and at bedtime. [provider] Taking Active   omeprazole (PRILOSEC) 40 MG capsule [id number] No Take 1 capsule (40 mg total) by mouth daily. Marianne Sofia, PA-C Taking Active   PREMARIN vaginal cream [id number] No Place 1 applicator vaginally 3 (three) times a week. [provider] Taking Active   trimethoprim (TRIMPEX) 100 MG tablet [id number] No Take 100 mg by mouth daily. [provider] Taking Active             Assessment/Plan:   Hyperlipidemia/ASCVD Risk Reduction: - Currently controlled - Recommend to continue current regimen, and PCP to fax prescription for reaptha 140mg  SQ every 14 days, to fax: [phone], will facilitate with provider.    Follow Up Plan: none scheduled, recommend patient outreach PCP office if needing update of prescription status  Lynnda Shields, PharmD, BCPS Clinical Pharmacist [facility]

## 2023-01-30 ENCOUNTER — Other Ambulatory Visit: Payer: Self-pay | Admitting: Physician Assistant

## 2023-01-30 DIAGNOSIS — E782 Mixed hyperlipidemia: Secondary | ICD-10-CM

## 2023-01-30 MED ORDER — REPATHA 140 MG/ML ~~LOC~~ SOSY
140.0000 mg | PREFILLED_SYRINGE | SUBCUTANEOUS | 1 refills | Status: DC
Start: 2023-01-30 — End: 2023-06-02

## 2023-01-30 NOTE — Progress Notes (Signed)
sent 

## 2023-02-10 DIAGNOSIS — M51369 Other intervertebral disc degeneration, lumbar region without mention of lumbar back pain or lower extremity pain: Secondary | ICD-10-CM | POA: Diagnosis not present

## 2023-02-10 DIAGNOSIS — M5416 Radiculopathy, lumbar region: Secondary | ICD-10-CM | POA: Diagnosis not present

## 2023-02-10 DIAGNOSIS — M4316 Spondylolisthesis, lumbar region: Secondary | ICD-10-CM | POA: Diagnosis not present

## 2023-03-13 DIAGNOSIS — R69 Illness, unspecified: Secondary | ICD-10-CM | POA: Diagnosis not present

## 2023-03-15 DIAGNOSIS — R0981 Nasal congestion: Secondary | ICD-10-CM | POA: Diagnosis not present

## 2023-03-15 DIAGNOSIS — R059 Cough, unspecified: Secondary | ICD-10-CM | POA: Diagnosis not present

## 2023-03-21 DIAGNOSIS — N3946 Mixed incontinence: Secondary | ICD-10-CM | POA: Diagnosis not present

## 2023-03-21 DIAGNOSIS — N3020 Other chronic cystitis without hematuria: Secondary | ICD-10-CM | POA: Diagnosis not present

## 2023-04-18 ENCOUNTER — Other Ambulatory Visit (HOSPITAL_COMMUNITY): Payer: Self-pay

## 2023-04-18 NOTE — Telephone Encounter (Signed)
PAP: Patient has been denied for pt assistance by [facility] due to NOT ELIGIBLE NOT COVERAGE OF MEDICATION PT HAS BEEN NOTIFIED BY COMPANY OF DENIAL   PLEASE BE ADVISED   [facility] [phone]

## 2023-04-29 ENCOUNTER — Telehealth: Payer: Self-pay

## 2023-04-29 NOTE — Telephone Encounter (Signed)
Copied from CRM [id number]. Topic: Clinical - Medical Advice >> Apr 29, 2023 10:32 AM Fuller Mandril wrote: Reason for CRM: Patient states someone called yesterday to check how her husband was doing after ED visit and told her if she started to have symptoms to call and provider would send something in for her. Husband was diagnosed with flu and patient states she started having headache, cough and body aches when she woke up. Wanted to see if provider could send something to [id number] - [place], [place] - [address] AT [address] Thank You

## 2023-04-29 NOTE — Telephone Encounter (Signed)
Please advise

## 2023-04-29 NOTE — Telephone Encounter (Signed)
Recommend pt do televisit

## 2023-04-29 NOTE — Telephone Encounter (Signed)
Called patient and she stated that she is felling a little better since she has taken medication for her headache, she stated she will call office back if she gets worst, or fell like she needs an appointment.

## 2023-05-21 ENCOUNTER — Ambulatory Visit: Payer: Medicare HMO | Admitting: Physician Assistant

## 2023-06-02 ENCOUNTER — Encounter: Payer: Self-pay | Admitting: Physician Assistant

## 2023-06-02 ENCOUNTER — Ambulatory Visit (INDEPENDENT_AMBULATORY_CARE_PROVIDER_SITE_OTHER): Payer: Medicare HMO | Admitting: Physician Assistant

## 2023-06-02 VITALS — BP 100/70 | HR 68 | Temp 97.2°F | Resp 18 | Ht 62.25 in | Wt 138.0 lb

## 2023-06-02 DIAGNOSIS — M810 Age-related osteoporosis without current pathological fracture: Secondary | ICD-10-CM

## 2023-06-02 DIAGNOSIS — Z1211 Encounter for screening for malignant neoplasm of colon: Secondary | ICD-10-CM | POA: Diagnosis not present

## 2023-06-02 DIAGNOSIS — J3089 Other allergic rhinitis: Secondary | ICD-10-CM

## 2023-06-02 DIAGNOSIS — Z1231 Encounter for screening mammogram for malignant neoplasm of breast: Secondary | ICD-10-CM

## 2023-06-02 DIAGNOSIS — E782 Mixed hyperlipidemia: Secondary | ICD-10-CM

## 2023-06-02 DIAGNOSIS — K219 Gastro-esophageal reflux disease without esophagitis: Secondary | ICD-10-CM

## 2023-06-02 MED ORDER — OMEPRAZOLE 40 MG PO CPDR: 40.0000 mg | DELAYED_RELEASE_CAPSULE | Freq: Every day | ORAL | 3 refills | Status: AC

## 2023-06-02 MED ORDER — FLUTICASONE PROPIONATE 50 MCG/ACT NA SUSP: 2.0000 | Freq: Every day | NASAL | 6 refills | Status: AC

## 2023-06-02 NOTE — Progress Notes (Signed)
 Subjective:  Patient ID: Kristen Mccarthy, female    DOB: December 17, 1945  Age: 78 y.o. MRN: [id number]  Chief Complaint  Patient presents with   Medical Management of Chronic Issues    HPI Mixed hyperlipidemia  Pt presents with hyperlipidemia. Patient used to be on repatha but now cannot afford and has been off for awhile.  She states she had some issues with statins in past causing mild leg achiness but would be willing to try again if needed based on her labwork  Pt had hypertension in past but has been losing weight by watching diet and has not taken medication in several months BP today good at 100/70 Denies chest pain/sob  Pt complains of allergies - keeps clear runny nose - currently using zyrtec 10mg  qd  Pt with GERD - takes prilosec 40mg  qd which controls her symptoms - [facility] refill to [facility]  Pt follows with [facility] for recurrent utis and stress incontinence - she now takes daily macrobid and on gemtesa 75mg  qd and doing well with those medications  Pt is due for cologuard - would like ordered  Pt due for mammogram - would like done at [facility]     06/02/2023    9:28 AM 11/26/2022    3:20 PM 11/15/2022   10:03 AM 08/07/2022   10:46 AM 02/15/2022   10:45 AM  Depression screen PHQ 2/9  Decreased Interest 0 0 0 1 0  Down, Depressed, Hopeless 0 0 0 0 0  PHQ - 2 Score 0 0 0 1 0  Altered sleeping 0 0 0 1   Tired, decreased energy 0 0 0 1   Change in appetite 0 0 0 1   Feeling bad or failure about yourself  0 0 0 0   Trouble concentrating 0 0 0 0   Moving slowly or fidgety/restless 0 0 0 0   Suicidal thoughts 0 0 0 0   PHQ-9 Score 0 0 0 4   Difficult doing work/chores Not difficult at all Not difficult at all Not difficult at all Not difficult at all         05/07/2022    9:08 AM 08/07/2022   10:46 AM 11/15/2022   10:03 AM 11/26/2022    3:22 PM 06/02/2023    9:27 AM  Fall Risk  Falls in the past year? 1 1 0 0 0  Was there an injury with Fall? 0 0 0 0 0   Fall Risk Category Calculator 1 1 0 0 0  Patient at Risk for Falls Due to No Fall Risks History of fall(s) No Fall Risks No Fall Risks No Fall Risks  Fall risk Follow up  Falls evaluation completed Falls evaluation completed Falls prevention discussed Falls evaluation completed     ROS CONSTITUTIONAL: Negative for chills, fatigue, fever, unintentional weight gain and unintentional weight loss.  E/N/T: see HPI CARDIOVASCULAR: Negative for chest pain, dizziness, palpitations and pedal edema.  RESPIRATORY: Negative for recent cough and dyspnea.  GASTROINTESTINAL: Negative for abdominal pain, acid reflux symptoms, constipation, diarrhea, nausea and vomiting.  MSK: Negative for arthralgias and myalgias.  INTEGUMENTARY: Negative for rash.   PSYCHIATRIC: Negative for sleep disturbance and to question depression screen.  Negative for depression, negative for anhedonia.    Current Outpatient Medications:    cetirizine (ZYRTEC) 10 MG tablet, Take 10 mg by mouth daily., Disp: , Rfl:    Cholecalciferol (VITAMIN D-3) 125 MCG (5000 UT) TABS, Take by mouth., Disp: , Rfl:  D-MANNOSE PO, Take by mouth., Disp: , Rfl:    fluticasone (FLONASE) 50 MCG/ACT nasal spray, Place 2 sprays into both nostrils daily., Disp: 16 g, Rfl: 6   GEMTESA 75 MG TABS, Take 1 tablet by mouth daily., Disp: , Rfl:    HM MAGNESIUM CITRATE PO, Take 400 mg by mouth in the morning and at bedtime., Disp: , Rfl:    nitrofurantoin (MACRODANTIN) 100 MG capsule, Take by mouth., Disp: , Rfl:    Omega-3 1000 MG CAPS, Take 1 capsule by mouth in the morning and at bedtime., Disp: , Rfl:    omeprazole (PRILOSEC) 40 MG capsule, Take 1 capsule (40 mg total) by mouth daily., Disp: 90 capsule, Rfl: 3  Past Medical History:  Diagnosis Date   Age-related osteoporosis without current pathological fracture 07/23/2019   Breast mass, left    Calculus of gallbladder with chronic cholecystitis without obstruction 09/04/2015   Complication of  anesthesia    Diffuse cystic mastopathy of right breast 07/23/2019   GERD (gastroesophageal reflux disease)    Hyperlipidemia    IBS (irritable bowel syndrome)    Mixed hyperlipidemia 07/23/2019   Osteoporosis    Other acute recurrent sinusitis 07/23/2019   PONV (postoperative nausea and vomiting)    Objective:  PHYSICAL EXAM:   BP 100/70 (BP Location: Right Arm, Patient Position: Sitting, Cuff Size: Normal)   Pulse 68   Temp (!) 97.2 F (36.2 C) (Temporal)   Resp 18   Ht 5' 2.25" (1.581 m)   Wt 138 lb (62.6 kg)   SpO2 95%   BMI 25.04 kg/m    GEN: Well nourished, well developed, in no acute distress  Cardiac: RRR; no murmurs, rubs, or gallops,no edema -  Respiratory:  normal respiratory rate and pattern with no distress - normal breath sounds with no rales, rhonchi, wheezes or rubs  Skin: warm and dry, no rash  Neuro:  Alert and Oriented x 3,  - CN II-Xii grossly intact Psych: euthymic mood, appropriate affect and demeanor  Assessment & Plan:    Mixed hyperlipidemia -     CBC with Differential/Platelet -     Comprehensive metabolic panel -     Lipid panel  Osteoporosis without current pathological fracture, unspecified osteoporosis type -     VITAMIN D 25 Hydroxy (Vit-D Deficiency, Fractures)  Colon cancer screening -     Cologuard  Visit for screening mammogram -     3D Screening Mammogram, Left and Right; Future  Gastroesophageal reflux disease without esophagitis -     Omeprazole; Take 1 capsule (40 mg total) by mouth daily.  Dispense: 90 capsule; Refill: 3  Seasonal allergic rhinitis due to other allergic trigger -     Fluticasone Propionate; Place 2 sprays into both nostrils daily.  Dispense: 16 g; Refill: 6     Follow-up: Return in about 6 months (around 12/03/2023) for chronic fasting follow-up.  An After Visit Summary was printed and given to the patient.  Jettie Pagan [facility] [phone]

## 2023-06-03 ENCOUNTER — Other Ambulatory Visit: Payer: Self-pay | Admitting: Physician Assistant

## 2023-06-03 ENCOUNTER — Encounter: Payer: Self-pay | Admitting: Physician Assistant

## 2023-06-03 DIAGNOSIS — E782 Mixed hyperlipidemia: Secondary | ICD-10-CM

## 2023-06-03 LAB — CBC WITH DIFFERENTIAL/PLATELET
Basophils Absolute: 0 10*3/uL (ref 0.0–0.2)
Basos: 1 %
EOS (ABSOLUTE): 0.4 10*3/uL (ref 0.0–0.4)
Eos: 6 %
Hematocrit: 46.3 % (ref 34.0–46.6)
Hemoglobin: 15.6 g/dL (ref 11.1–15.9)
Immature Grans (Abs): 0 10*3/uL (ref 0.0–0.1)
Immature Granulocytes: 0 %
Lymphocytes Absolute: 2.5 10*3/uL (ref 0.7–3.1)
Lymphs: 39 %
MCH: 31.3 pg (ref 26.6–33.0)
MCHC: 33.7 g/dL (ref 31.5–35.7)
MCV: 93 fL (ref 79–97)
Monocytes Absolute: 0.7 10*3/uL (ref 0.1–0.9)
Monocytes: 10 %
Neutrophils Absolute: 2.8 10*3/uL (ref 1.4–7.0)
Neutrophils: 44 %
Platelets: 244 10*3/uL (ref 150–450)
RBC: 4.99 x10E6/uL (ref 3.77–5.28)
RDW: 12.2 % (ref 11.7–15.4)
WBC: 6.4 10*3/uL (ref 3.4–10.8)

## 2023-06-03 LAB — LIPID PANEL
Chol/HDL Ratio: 6.6 ratio — ABNORMAL HIGH (ref 0.0–4.4)
Cholesterol, Total: 256 mg/dL — ABNORMAL HIGH (ref 100–199)
HDL: 39 mg/dL — ABNORMAL LOW (ref >39)
LDL Chol Calc (NIH): 165 mg/dL — ABNORMAL HIGH (ref 0–99)
Triglycerides: 274 mg/dL — ABNORMAL HIGH (ref 0–149)
VLDL Cholesterol Cal: 52 mg/dL — ABNORMAL HIGH (ref 5–40)

## 2023-06-03 LAB — COMPREHENSIVE METABOLIC PANEL
ALT: 23 IU/L (ref 0–32)
AST: 23 IU/L (ref 0–40)
Albumin: 4.6 g/dL (ref 3.8–4.8)
Alkaline Phosphatase: 86 IU/L (ref 44–121)
BUN/Creatinine Ratio: 25 (ref 12–28)
BUN: 18 mg/dL (ref 8–27)
Bilirubin Total: 0.5 mg/dL (ref 0.0–1.2)
CO2: 21 mmol/L (ref 20–29)
Calcium: 10.7 mg/dL — ABNORMAL HIGH (ref 8.7–10.3)
Chloride: 101 mmol/L (ref 96–106)
Creatinine, Ser: 0.71 mg/dL (ref 0.57–1.00)
Globulin, Total: 2.5 g/dL (ref 1.5–4.5)
Glucose: 108 mg/dL — ABNORMAL HIGH (ref 70–99)
Potassium: 4.3 mmol/L (ref 3.5–5.2)
Sodium: 138 mmol/L (ref 134–144)
Total Protein: 7.1 g/dL (ref 6.0–8.5)
eGFR: 87 mL/min/{1.73_m2} (ref >59)

## 2023-06-03 LAB — VITAMIN D 25 HYDROXY (VIT D DEFICIENCY, FRACTURES): Vit D, 25-Hydroxy: 71.7 ng/mL (ref 30.0–100.0)

## 2023-06-03 MED ORDER — ATORVASTATIN CALCIUM 10 MG PO TABS: 10.0000 mg | ORAL_TABLET | Freq: Every day | ORAL | 0 refills | Status: DC

## 2023-06-09 ENCOUNTER — Other Ambulatory Visit

## 2023-06-10 ENCOUNTER — Encounter: Payer: Self-pay | Admitting: Physician Assistant

## 2023-06-10 ENCOUNTER — Other Ambulatory Visit: Payer: Self-pay | Admitting: Physician Assistant

## 2023-06-10 LAB — BASIC METABOLIC PANEL
BUN/Creatinine Ratio: 18 (ref 12–28)
BUN: 13 mg/dL (ref 8–27)
CO2: 18 mmol/L — ABNORMAL LOW (ref 20–29)
Calcium: 10.7 mg/dL — ABNORMAL HIGH (ref 8.7–10.3)
Chloride: 103 mmol/L (ref 96–106)
Creatinine, Ser: 0.73 mg/dL (ref 0.57–1.00)
Glucose: 97 mg/dL (ref 70–99)
Potassium: 4.5 mmol/L (ref 3.5–5.2)
Sodium: 141 mmol/L (ref 134–144)
eGFR: 84 mL/min/{1.73_m2} (ref >59)

## 2023-06-12 DIAGNOSIS — Z1211 Encounter for screening for malignant neoplasm of colon: Secondary | ICD-10-CM | POA: Diagnosis not present

## 2023-06-18 ENCOUNTER — Other Ambulatory Visit: Payer: Self-pay | Admitting: Physician Assistant

## 2023-06-18 DIAGNOSIS — E782 Mixed hyperlipidemia: Secondary | ICD-10-CM

## 2023-06-18 LAB — COLOGUARD: COLOGUARD: NEGATIVE

## 2023-06-18 MED ORDER — ATORVASTATIN CALCIUM 10 MG PO TABS: 10.0000 mg | ORAL_TABLET | Freq: Every day | ORAL | 0 refills | Status: DC

## 2023-06-18 NOTE — Telephone Encounter (Signed)
 Copied from CRM [id number]. Topic: Clinical - Medication Question >> Jun 18, 2023 12:40 PM Nyra Capes wrote: Reason for CRM: patient called in stating the  order for atorvastatin (LIPITOR) 10 MG tablet needs to be sent to  [facility] pharmacy to be filled. Patient ph # [phone] and is okay with leaving detailed message.

## 2023-06-19 ENCOUNTER — Telehealth: Payer: Self-pay

## 2023-06-19 NOTE — Telephone Encounter (Signed)
 Patient called in to make appointment for lab draw to recheck calcium. Appointment made for 07/08/2023.

## 2023-06-19 NOTE — Telephone Encounter (Signed)
 LM for the patient to call the office back.

## 2023-06-19 NOTE — Telephone Encounter (Signed)
 Copied from CRM [id number]. Topic: Clinical - Request for Lab/Test Order >> Jun 18, 2023 12:34 PM Nyra Capes wrote: Reason for CRM: patient calling in asking for an order for blood work and wants to make an appt for labs  patient ph# [phone] okay to leave a detailed message >> Jun 18, 2023  3:19 PM CMA Laverle Patter B wrote: I see labs future ordered already. Unless I am missing something... she only needs an appt.

## 2023-06-24 DIAGNOSIS — Z1231 Encounter for screening mammogram for malignant neoplasm of breast: Secondary | ICD-10-CM | POA: Diagnosis not present

## 2023-06-24 LAB — HM MAMMOGRAPHY

## 2023-06-25 ENCOUNTER — Encounter: Payer: Self-pay | Admitting: Physician Assistant

## 2023-06-25 DIAGNOSIS — Z1231 Encounter for screening mammogram for malignant neoplasm of breast: Secondary | ICD-10-CM | POA: Diagnosis not present

## 2023-07-01 DIAGNOSIS — Z79899 Other long term (current) drug therapy: Secondary | ICD-10-CM | POA: Diagnosis not present

## 2023-07-01 DIAGNOSIS — Z780 Asymptomatic menopausal state: Secondary | ICD-10-CM | POA: Diagnosis not present

## 2023-07-01 DIAGNOSIS — M810 Age-related osteoporosis without current pathological fracture: Secondary | ICD-10-CM | POA: Diagnosis not present

## 2023-07-07 ENCOUNTER — Other Ambulatory Visit: Payer: Self-pay

## 2023-07-07 ENCOUNTER — Encounter: Payer: Self-pay | Admitting: Physician Assistant

## 2023-07-07 ENCOUNTER — Ambulatory Visit (INDEPENDENT_AMBULATORY_CARE_PROVIDER_SITE_OTHER): Admitting: Physician Assistant

## 2023-07-07 VITALS — BP 112/70 | HR 86 | Temp 98.3°F | Resp 18 | Ht 62.25 in | Wt 138.6 lb

## 2023-07-07 DIAGNOSIS — M069 Rheumatoid arthritis, unspecified: Secondary | ICD-10-CM | POA: Insufficient documentation

## 2023-07-07 DIAGNOSIS — F5101 Primary insomnia: Secondary | ICD-10-CM

## 2023-07-07 MED ORDER — PREDNISONE 20 MG PO TABS: ORAL_TABLET | ORAL | 0 refills | Status: AC

## 2023-07-07 NOTE — Progress Notes (Signed)
 Acute Office Visit  Subjective:    Patient ID: Kristen Mccarthy, female    DOB: 1945-12-13, 78 y.o.   MRN: [id number]  Chief Complaint  Patient presents with   Hand Pain   Insomnia    HPI: Patient is in today for complaints of bilateral hand pain.  Pt has diagnosis of RA but cannot get in to her specialist until July .  States for the past month she has been having moderate pain in hands and wrists with noted joint abnormalities. She has been taking Tylenol arthritis which helps minimally.  She has not been treated with medication for RA yet - states it had not really bothered her this much in the past  Pt would like to try medication for insomnia.  She states she has trazadone at home which was her husbands med. She took one half of a pill and states it did not help.  She never tried another dose.  Recommend she actually try 25mg  at bedtime for 1-2 weeks and then notify how it works   Current Outpatient Medications:    atorvastatin (LIPITOR) 10 MG tablet, Take 1 tablet (10 mg total) by mouth daily., Disp: 90 tablet, Rfl: 0   Cholecalciferol (VITAMIN D-3) 125 MCG (5000 UT) TABS, Take by mouth. 3 times a week, Disp: , Rfl:    fluticasone (FLONASE) 50 MCG/ACT nasal spray, Place 2 sprays into both nostrils daily., Disp: 16 g, Rfl: 6   GEMTESA 75 MG TABS, Take 1 tablet by mouth daily., Disp: , Rfl:    HM MAGNESIUM CITRATE PO, Take 400 mg by mouth in the morning and at bedtime., Disp: , Rfl:    nitrofurantoin (MACRODANTIN) 100 MG capsule, Take by mouth., Disp: , Rfl:    Omega-3 1000 MG CAPS, Take 1 capsule by mouth in the morning and at bedtime., Disp: , Rfl:    omeprazole (PRILOSEC) 40 MG capsule, Take 1 capsule (40 mg total) by mouth daily., Disp: 90 capsule, Rfl: 3   predniSONE (DELTASONE) 20 MG tablet, 1 po tid for 3 days then 1 po bid for 3 days then 1 po qd for 3 days, Disp: 18 tablet, Rfl: 0   traZODone (DESYREL) 50 MG tablet, Take 50 mg by mouth at bedtime. 1/2 - 1 po at bedtime,  Disp: , Rfl:   Allergies  Allergen Reactions   Azithromycin Rash and Dermatitis   Clindamycin/Lincomycin Rash   Penicillins Rash   Sulfa Antibiotics Other (See Comments)    Stomach ache Stomach ache Other reaction(s): Other (See Comments) Stomach ache Other reaction(s): Abdominal Pain, Other Stomach ache Stomach ache Stomach ache Stomach ache Stomach ache Stomach ache    ROS CONSTITUTIONAL: Negative for chills, fatigue, fever,  CARDIOVASCULAR: Negative for chest pain,  RESPIRATORY: Negative for recent cough and dyspnea.   MSK: see HPI   PSYCHIATRIC: see HPI     Objective:    PHYSICAL EXAM:   BP 112/70   Pulse 86   Temp 98.3 F (36.8 C) (Temporal)   Resp 18   Ht 5' 2.25" (1.581 m)   Wt 138 lb 9.6 oz (62.9 kg)   SpO2 96%   BMI 25.15 kg/m    GEN: Well nourished, well developed, in no acute distress   Cardiac: RRR; no murmurs, rubs, or gallops,no edema -  Respiratory:  normal respiratory rate and pattern with no distress - normal breath sounds with no rales, rhonchi, wheezes or rubs  MS: joint abnormalities noted from RA Skin: warm and  dry, no rash   Psych: euthymic mood, appropriate affect and demeanor     Assessment & Plan:    Rheumatoid arthritis of both wrists, unspecified whether rheumatoid factor present (HCC) -     predniSONE; 1 po tid for 3 days then 1 po bid for 3 days then 1 po qd for 3 days  Dispense: 18 tablet; Refill: 0 Follow up with rheumatologist as directed Primary insomnia Try trazodone 25mg  at bedtime for at least 2 weeks - notify how med works for her    Follow-up: Return if symptoms worsen or fail to improve.  An After Visit Summary was printed and given to the patient.  Jettie Pagan [facility] [phone]

## 2023-07-08 ENCOUNTER — Other Ambulatory Visit

## 2023-07-11 LAB — INTACT PTH (INCLUDES CALCIUM)
Calcium, Serum: 11.1 mg/dL — ABNORMAL HIGH
PTH (Intact Assay): 30 pg/mL

## 2023-07-14 ENCOUNTER — Other Ambulatory Visit: Payer: Self-pay | Admitting: Physician Assistant

## 2023-07-14 DIAGNOSIS — R899 Unspecified abnormal finding in specimens from other organs, systems and tissues: Secondary | ICD-10-CM

## 2023-07-22 ENCOUNTER — Other Ambulatory Visit

## 2023-07-22 DIAGNOSIS — R899 Unspecified abnormal finding in specimens from other organs, systems and tissues: Secondary | ICD-10-CM

## 2023-07-22 DIAGNOSIS — H26491 Other secondary cataract, right eye: Secondary | ICD-10-CM | POA: Diagnosis not present

## 2023-07-23 ENCOUNTER — Other Ambulatory Visit

## 2023-07-23 ENCOUNTER — Encounter: Payer: Self-pay | Admitting: Physician Assistant

## 2023-07-23 DIAGNOSIS — K219 Gastro-esophageal reflux disease without esophagitis: Secondary | ICD-10-CM | POA: Diagnosis not present

## 2023-07-23 DIAGNOSIS — M4807 Spinal stenosis, lumbosacral region: Secondary | ICD-10-CM | POA: Diagnosis not present

## 2023-07-23 DIAGNOSIS — M150 Primary generalized (osteo)arthritis: Secondary | ICD-10-CM | POA: Diagnosis not present

## 2023-07-23 DIAGNOSIS — R739 Hyperglycemia, unspecified: Secondary | ICD-10-CM | POA: Diagnosis not present

## 2023-07-23 DIAGNOSIS — M2550 Pain in unspecified joint: Secondary | ICD-10-CM | POA: Diagnosis not present

## 2023-07-23 DIAGNOSIS — M818 Other osteoporosis without current pathological fracture: Secondary | ICD-10-CM | POA: Diagnosis not present

## 2023-07-23 DIAGNOSIS — R5382 Chronic fatigue, unspecified: Secondary | ICD-10-CM | POA: Diagnosis not present

## 2023-07-23 DIAGNOSIS — Z9181 History of falling: Secondary | ICD-10-CM | POA: Diagnosis not present

## 2023-07-23 DIAGNOSIS — N390 Urinary tract infection, site not specified: Secondary | ICD-10-CM | POA: Diagnosis not present

## 2023-07-23 LAB — BASIC METABOLIC PANEL WITH GFR
BUN/Creatinine Ratio: 20 (ref 12–28)
BUN: 12 mg/dL (ref 8–27)
CO2: 21 mmol/L (ref 20–29)
Calcium: 10.4 mg/dL — ABNORMAL HIGH (ref 8.7–10.3)
Chloride: 105 mmol/L (ref 96–106)
Creatinine, Ser: 0.59 mg/dL (ref 0.57–1.00)
Glucose: 136 mg/dL — ABNORMAL HIGH (ref 70–99)
Potassium: 4.2 mmol/L (ref 3.5–5.2)
Sodium: 138 mmol/L (ref 134–144)
eGFR: 92 mL/min/{1.73_m2} (ref >59)

## 2023-08-18 DIAGNOSIS — M791 Myalgia, unspecified site: Secondary | ICD-10-CM | POA: Diagnosis not present

## 2023-08-18 DIAGNOSIS — Z23 Encounter for immunization: Secondary | ICD-10-CM | POA: Diagnosis not present

## 2023-08-18 DIAGNOSIS — R350 Frequency of micturition: Secondary | ICD-10-CM | POA: Diagnosis not present

## 2023-08-18 DIAGNOSIS — M818 Other osteoporosis without current pathological fracture: Secondary | ICD-10-CM | POA: Diagnosis not present

## 2023-08-18 DIAGNOSIS — R270 Ataxia, unspecified: Secondary | ICD-10-CM | POA: Diagnosis not present

## 2023-08-18 DIAGNOSIS — E782 Mixed hyperlipidemia: Secondary | ICD-10-CM | POA: Diagnosis not present

## 2023-08-19 ENCOUNTER — Other Ambulatory Visit: Payer: Self-pay | Admitting: Physician Assistant

## 2023-08-19 DIAGNOSIS — E782 Mixed hyperlipidemia: Secondary | ICD-10-CM

## 2023-09-09 DIAGNOSIS — M810 Age-related osteoporosis without current pathological fracture: Secondary | ICD-10-CM | POA: Diagnosis not present

## 2023-09-10 DIAGNOSIS — M810 Age-related osteoporosis without current pathological fracture: Secondary | ICD-10-CM | POA: Diagnosis not present

## 2023-09-20 DIAGNOSIS — R21 Rash and other nonspecific skin eruption: Secondary | ICD-10-CM | POA: Diagnosis not present

## 2023-09-20 DIAGNOSIS — M79605 Pain in left leg: Secondary | ICD-10-CM | POA: Diagnosis not present

## 2023-09-22 DIAGNOSIS — R21 Rash and other nonspecific skin eruption: Secondary | ICD-10-CM | POA: Diagnosis not present

## 2023-09-22 DIAGNOSIS — L255 Unspecified contact dermatitis due to plants, except food: Secondary | ICD-10-CM | POA: Diagnosis not present

## 2023-10-17 DIAGNOSIS — R2681 Unsteadiness on feet: Secondary | ICD-10-CM | POA: Diagnosis not present

## 2023-10-17 DIAGNOSIS — M48061 Spinal stenosis, lumbar region without neurogenic claudication: Secondary | ICD-10-CM | POA: Diagnosis not present

## 2023-10-17 DIAGNOSIS — M47816 Spondylosis without myelopathy or radiculopathy, lumbar region: Secondary | ICD-10-CM | POA: Diagnosis not present

## 2023-10-17 DIAGNOSIS — R260 Ataxic gait: Secondary | ICD-10-CM | POA: Diagnosis not present

## 2023-10-18 DIAGNOSIS — M47814 Spondylosis without myelopathy or radiculopathy, thoracic region: Secondary | ICD-10-CM | POA: Diagnosis not present

## 2023-10-18 DIAGNOSIS — R260 Ataxic gait: Secondary | ICD-10-CM | POA: Diagnosis not present

## 2023-10-18 DIAGNOSIS — R2681 Unsteadiness on feet: Secondary | ICD-10-CM | POA: Diagnosis not present

## 2023-10-23 DIAGNOSIS — M4802 Spinal stenosis, cervical region: Secondary | ICD-10-CM | POA: Diagnosis not present

## 2023-10-23 DIAGNOSIS — M5030 Other cervical disc degeneration, unspecified cervical region: Secondary | ICD-10-CM | POA: Diagnosis not present

## 2023-10-23 DIAGNOSIS — M47812 Spondylosis without myelopathy or radiculopathy, cervical region: Secondary | ICD-10-CM | POA: Diagnosis not present

## 2023-10-23 DIAGNOSIS — R2681 Unsteadiness on feet: Secondary | ICD-10-CM | POA: Diagnosis not present

## 2023-10-23 DIAGNOSIS — R260 Ataxic gait: Secondary | ICD-10-CM | POA: Diagnosis not present

## 2023-11-25 ENCOUNTER — Ambulatory Visit: Admitting: Physician Assistant

## 2023-12-04 ENCOUNTER — Ambulatory Visit

## 2024-02-08 ENCOUNTER — Other Ambulatory Visit: Payer: Self-pay | Admitting: Physician Assistant

## 2024-02-08 DIAGNOSIS — K219 Gastro-esophageal reflux disease without esophagitis: Secondary | ICD-10-CM

## 2024-03-17 ENCOUNTER — Other Ambulatory Visit: Payer: Self-pay | Admitting: Physician Assistant

## 2024-03-17 DIAGNOSIS — K219 Gastro-esophageal reflux disease without esophagitis: Secondary | ICD-10-CM
